# Patient Record
Sex: Female | Born: 1970 | Race: White | Hispanic: No | Marital: Married | Smoking: Never smoker
Health system: Southern US, Community
[De-identification: ages and names within clinical notes are randomized; demographics above are authoritative.]

## PROBLEM LIST (undated history)

## (undated) DIAGNOSIS — IMO0002 Reserved for concepts with insufficient information to code with codable children: Secondary | ICD-10-CM

## (undated) DIAGNOSIS — M7500 Adhesive capsulitis of unspecified shoulder: Secondary | ICD-10-CM

## (undated) HISTORY — PX: OTHER SURGICAL HISTORY: SHX169

## (undated) HISTORY — PX: SHOULDER SURGERY: SHX246

## (undated) HISTORY — PX: APPENDECTOMY: SHX54

## (undated) HISTORY — DX: Adhesive capsulitis of unspecified shoulder: M75.00

## (undated) HISTORY — DX: Reserved for concepts with insufficient information to code with codable children: IMO0002

## (undated) HISTORY — PX: TUBAL LIGATION: SHX77

---

## 2001-05-05 ENCOUNTER — Encounter: Admission: RE | Admit: 2001-05-05 | Discharge: 2001-05-05 | Payer: Self-pay | Admitting: Sports Medicine

## 2001-06-13 ENCOUNTER — Encounter: Admission: RE | Admit: 2001-06-13 | Discharge: 2001-06-13 | Payer: Self-pay | Admitting: Sports Medicine

## 2001-06-17 ENCOUNTER — Other Ambulatory Visit: Admission: RE | Admit: 2001-06-17 | Discharge: 2001-06-17 | Payer: Self-pay | Admitting: Obstetrics and Gynecology

## 2002-06-30 ENCOUNTER — Other Ambulatory Visit: Admission: RE | Admit: 2002-06-30 | Discharge: 2002-06-30 | Payer: Self-pay | Admitting: Obstetrics and Gynecology

## 2002-12-15 ENCOUNTER — Other Ambulatory Visit: Admission: RE | Admit: 2002-12-15 | Discharge: 2002-12-15 | Payer: Self-pay | Admitting: Gynecology

## 2003-01-28 ENCOUNTER — Inpatient Hospital Stay (HOSPITAL_COMMUNITY): Admission: AD | Admit: 2003-01-28 | Discharge: 2003-01-28 | Payer: Self-pay | Admitting: Gynecology

## 2003-07-21 ENCOUNTER — Inpatient Hospital Stay (HOSPITAL_COMMUNITY): Admission: AD | Admit: 2003-07-21 | Discharge: 2003-07-25 | Payer: Self-pay | Admitting: Gynecology

## 2003-07-22 ENCOUNTER — Encounter (INDEPENDENT_AMBULATORY_CARE_PROVIDER_SITE_OTHER): Payer: Self-pay

## 2003-08-31 ENCOUNTER — Other Ambulatory Visit: Admission: RE | Admit: 2003-08-31 | Discharge: 2003-08-31 | Payer: Self-pay | Admitting: Gynecology

## 2003-11-17 ENCOUNTER — Encounter: Admission: RE | Admit: 2003-11-17 | Discharge: 2003-12-17 | Payer: Self-pay | Admitting: Gynecology

## 2004-09-28 ENCOUNTER — Other Ambulatory Visit: Admission: RE | Admit: 2004-09-28 | Discharge: 2004-09-28 | Payer: Self-pay | Admitting: Gynecology

## 2005-04-06 ENCOUNTER — Other Ambulatory Visit: Admission: RE | Admit: 2005-04-06 | Discharge: 2005-04-06 | Payer: Self-pay | Admitting: Gynecology

## 2005-10-23 ENCOUNTER — Inpatient Hospital Stay (HOSPITAL_COMMUNITY): Admission: RE | Admit: 2005-10-23 | Discharge: 2005-10-26 | Payer: Self-pay | Admitting: Gynecology

## 2005-12-07 ENCOUNTER — Other Ambulatory Visit: Admission: RE | Admit: 2005-12-07 | Discharge: 2005-12-07 | Payer: Self-pay | Admitting: Gynecology

## 2006-12-10 ENCOUNTER — Other Ambulatory Visit: Admission: RE | Admit: 2006-12-10 | Discharge: 2006-12-10 | Payer: Self-pay | Admitting: Gynecology

## 2007-08-05 ENCOUNTER — Inpatient Hospital Stay (HOSPITAL_COMMUNITY): Admission: AD | Admit: 2007-08-05 | Discharge: 2007-08-05 | Payer: Self-pay | Admitting: Obstetrics and Gynecology

## 2007-08-18 ENCOUNTER — Inpatient Hospital Stay (HOSPITAL_COMMUNITY): Admission: RE | Admit: 2007-08-18 | Discharge: 2007-08-21 | Payer: Self-pay | Admitting: Obstetrics and Gynecology

## 2007-08-18 ENCOUNTER — Encounter (INDEPENDENT_AMBULATORY_CARE_PROVIDER_SITE_OTHER): Payer: Self-pay | Admitting: Obstetrics and Gynecology

## 2007-08-26 ENCOUNTER — Ambulatory Visit: Admission: RE | Admit: 2007-08-26 | Discharge: 2007-08-26 | Payer: Self-pay | Admitting: Obstetrics and Gynecology

## 2007-09-02 ENCOUNTER — Ambulatory Visit: Admission: RE | Admit: 2007-09-02 | Discharge: 2007-09-02 | Payer: Self-pay | Admitting: Obstetrics and Gynecology

## 2008-12-13 ENCOUNTER — Encounter: Payer: Self-pay | Admitting: Gynecology

## 2008-12-13 ENCOUNTER — Other Ambulatory Visit: Admission: RE | Admit: 2008-12-13 | Discharge: 2008-12-13 | Payer: Self-pay | Admitting: Gynecology

## 2008-12-13 ENCOUNTER — Ambulatory Visit: Payer: Self-pay | Admitting: Gynecology

## 2009-12-15 ENCOUNTER — Ambulatory Visit: Payer: Self-pay | Admitting: Gynecology

## 2009-12-15 ENCOUNTER — Other Ambulatory Visit: Admission: RE | Admit: 2009-12-15 | Discharge: 2009-12-15 | Payer: Self-pay | Admitting: Gynecology

## 2009-12-20 ENCOUNTER — Ambulatory Visit: Payer: Self-pay | Admitting: Gynecology

## 2010-12-18 ENCOUNTER — Ambulatory Visit
Admission: RE | Admit: 2010-12-18 | Discharge: 2010-12-18 | Payer: Self-pay | Source: Home / Self Care | Attending: Gynecology | Admitting: Gynecology

## 2010-12-18 ENCOUNTER — Other Ambulatory Visit (HOSPITAL_COMMUNITY)
Admission: RE | Admit: 2010-12-18 | Discharge: 2010-12-18 | Disposition: A | Discharge status: Home / Self Care | Payer: 59 | Source: Ambulatory Visit | Attending: Gynecology | Admitting: Gynecology

## 2010-12-18 ENCOUNTER — Other Ambulatory Visit: Payer: Self-pay | Admitting: Gynecology

## 2010-12-18 DIAGNOSIS — Z124 Encounter for screening for malignant neoplasm of cervix: Secondary | ICD-10-CM | POA: Insufficient documentation

## 2011-04-03 NOTE — Discharge Summary (Signed)
[name], [name]                ACCOUNT NO.:  [id number]   MEDICAL RECORD NO.:  [id number]          PATIENT TYPE:  INP   LOCATION:                                [facility]   PHYSICIAN:  Osborn Coho, M.D.   DATE OF BIRTH:  1971/01/24   DATE OF ADMISSION:  08/21/2007  DATE OF DISCHARGE:                               DISCHARGE SUMMARY   CHIEF COMPLAINT:  Heart racing and episode of shortness of breath.   HISTORY:  [name] is a 40 year old, gravida 3, para 2, 0, 0, 2, at 38  weeks who presented complaining of heart racing occasionally since last  night with some episodes of shortness of breath.  The patient says she  is feeling a little better now.  She denies any chest pain.  She reports  good fetal movement.  She reports her pulse at times to be 90s-110s.   PHYSICAL EXAMINATION:  VITAL SIGNS:  Pulse 82.  EKG:  Normal sinus  rhythm.  LUNGS:  Clear to auscultation bilaterally.  CV:  Rate and rhythm are regular.  ABDOMEN:  Gravid and nontender.  Fetal heart tracing is reactive.   ASSESSMENT/PLAN:  This is a 40 year old, gravida 3, para 2 at 38 weeks  with questionable palpitations.  Will arrange outpatient appointment  with Cardiology.  The patient may need further workup with Holter  monitor.  Will appreciate Cardiology input.  TSH is pending.  Fetal  status is overall reassuring.  Discharge instructions discussed with the  patient.  The patient to follow up in office.      Osborn Coho, M.D.  Electronically Signed     AR/MEDQ  D:  08/21/2007  T:  08/21/2007  Job:  [id number]

## 2011-04-03 NOTE — Op Note (Signed)
[name], [name]                ACCOUNT NO.:  [id number]   MEDICAL RECORD NO.:  [id number]          PATIENT TYPE:  INP   LOCATION:  9199                          [facility]   PHYSICIAN:  [name], M.D.DATE OF BIRTH:  Jun 11, 1971   DATE OF PROCEDURE:  08/18/2007  DATE OF DISCHARGE:                               OPERATIVE REPORT   PREOPERATIVE DIAGNOSES:  1. Term intrauterine pregnancy.  2. Previous cesarean section.  3. Desires repeat cesarean section.  4. Desires sterilization.   POSTOPERATIVE DIAGNOSES:  1. Term intrauterine pregnancy.  2. Previous cesarean section.  3. Desires repeat cesarean section.  4. Desires sterilization.  5. Fibroid uterus.   PROCEDURES:  1. Repeat low transverse cesarean section.  2. Bilateral tubal ligation.   SURGEON:  [name], M.D.   FIRST ASSISTANT:  [name], C.N.M.   ANESTHETIC:  Spinal.   DISPOSITION:  [name] is a 40 year old female, gravida 3, para 2-0-0-  2, who presents at [redacted] weeks gestation [facility] August 18, 2007).  She has  the above-mentioned diagnosis.  She understands the indications for her  surgical procedure, and she accepts the risks of, but not limited to,  anesthetic complications, bleeding, infections, possible damage to the  surrounding organs, and possible tubal failure (24/1000).   FINDINGS:  A 8 pound 7 ounce female was delivered from a cephalic  presentation.  The Apgars were 9 at one minute and 9 at five minutes.  The uterus, fallopian tubes, and the ovaries were normal, except that  there were two fibroids measuring less than 0.5 cm each on the anterior  portion of the uterus.   PROCEDURE:  The patient was taken to the operating room, where a spinal  anesthetic was given.  The patient's abdomen, perineum, and outer vagina  were prepped with multiple layers of Betadine.  A Foley catheter was  placed in the bladder.  The patient was sterilely draped.  The lower  abdomen  was injected with 10 cc of 0.25% Marcaine with epinephrine.  A  low transverse incision was made in the abdomen through the previous  incision and then carried sharply through the subcutaneous tissue, the  fascia, and the anterior peritoneum.  The bladder flap was developed.  An incision was made in the lower uterine segment and extended in a low  transverse fashion.  The fetal head was delivered with the assistance of  a vacuum extractor.  The mouth and nose were suctioned.  The remainder  of the infant was then delivered.  The cord was clamped and cut, and the  infant was handed to the awaiting pediatric team.  Routine cord blood  studies were obtained.  The placenta was removed.  The uterine cavity  was cleaned of amniotic fluid, clotted blood, and membranes.  The  uterine incision was closed using a running-locking suture of 2-0 Vicryl  followed by an imbricating suture of 2-0 Vicryl.  Hemostasis was  achieved using figure-of-eight sutures of 2-0 Vicryl and 0 Vicryl.  The  left fallopian tube was identified and followed to its fimbriated end.  A knuckle of tube was made on the left using a free tie and then two  suture ligatures of 0 plain catgut.  The knuckle of tube thus made was  excised.  Hemostasis was adequate.  An identical procedure was carried  out on the opposite side.  Again, hemostasis was adequate.  The pelvis  was vigorously irrigated.  The anterior peritoneum and the abdominal  musculature were reapproximated in the midline using 0 Vicryl.  The  fascia was closed using a running suture of 0 Vicryl followed by three  interrupted sutures of 0 Vicryl.  The subcutaneous layer was closed  using a running suture of 0 Vicryl.  The skin was reapproximated using a  subcuticular suture of 3-0 Monocryl.  Sponge, needle, and instrument  counts were correct on two occasions.  The estimated blood loss for the  procedure was 700 cc.  The patient tolerated her procedure well.  The   patient was taken to the recovery room in stable condition.  The  placenta was sent to labor and delivery.  The cut portions of the  fallopian tubes were sent to pathology.      [name], M.D.  Electronically Signed     AVS/MEDQ  D:  08/18/2007  T:  08/18/2007  Job:  [id number]

## 2011-04-03 NOTE — H&P (Signed)
NAMEGARIMA, [name]                ACCOUNT NO.:  [id number]   MEDICAL RECORD NO.:  [id number]          PATIENT TYPE:  INP   LOCATION:  NA                            [facility]   PHYSICIAN:  Janine Limbo, M.D.DATE OF BIRTH:  [birth year]   DATE OF ADMISSION:  08/18/2007  DATE OF DISCHARGE:                              HISTORY & PHYSICAL   HISTORY OF PRESENT ILLNESS:  [name] is a 40 year old female, gravida  3, para 2-0-0-2, who presents at 41 weeks' gestation ([facility] of August 18, 2007).  She has been followed at the [facility].  The patient has  had two prior cesarean sections and she desires a repeat cesarean  section.  The patient also has a history of postpartum depression.   OBSTETRICAL HISTORY:  In 2004 the patient had a cesarean section at term  where she delivered a 9 pound 11 ounce female infant.  In December 2006  the patient had a cesarean section at term where she delivered a 7 pound  10 ounce female infant.   DRUG ALLERGIES:  The patient has seasonal allergies.   PAST MEDICAL HISTORY:  The patient has a history of depression.  She had  a broken elbow in 1980, repaired.  She had her wisdom teeth removed in  1993.  She had her appendix removed in 1999.   SOCIAL HISTORY:  The patient denies cigarette use, alcohol use, and  recreational drug use.   REVIEW OF SYSTEMS:  Normal pregnancy complaints.   FAMILY HISTORY:  Noncontributory.   PHYSICAL EXAM:  Height is 5 feet 9-1/2 inches.  Weight is 190 pounds.  HEENT:  Within normal limits.  CHEST:  Clear.  HEART:  Regular rate and rhythm.  BREASTS:  Without masses.  ABDOMEN:  Gravid with a fundal height of 38 cm.  EXTREMITIES:  Grossly normal.  NEUROLOGIC:  Grossly normal.  PELVIC:  The cervix is fingertip dilated and 50% effaced.   LABORATORY VALUES:  Blood type is A positive, antibody screen negative.  VDRL nonreactive.  Rubella  immune.  HBSAG negative.  Chlamydia negative.  Gonorrhea negative.  Third trimester beta strep is negative.  Glucola  screen is within normal limits.   ASSESSMENT:  1. Forty weeks' gestation.  2. Two prior cesarean sections.  3. Desires repeat cesarean section.  4. Desires sterilization.   PLAN:  The patient will undergo a repeat low transverse cesarean section  and bilateral tubal ligation.  The patient understands the indications  for her surgical procedure and she accepts the risks of, but not limited  to, anesthetic complications, bleeding, infection, possible damage to  the surrounding organs, and possible tubal failure (24 per 1000).      Janine Limbo, M.D.  Electronically Signed     AVS/MEDQ  D:  08/14/2007  T:  08/14/2007  Job:  [id number]

## 2011-04-03 NOTE — Discharge Summary (Signed)
Andrea, [name]                ACCOUNT NO.:  [id number]   MEDICAL RECORD NO.:  [id number]          PATIENT TYPE:  INP   LOCATION:  9135                          [facility]   PHYSICIAN:  Osborn Coho, M.D.   DATE OF BIRTH:  Dec 14, 1970   DATE OF ADMISSION:  08/18/2007  DATE OF DISCHARGE:  08/21/2007                               DISCHARGE SUMMARY   ADMITTING DIAGNOSES:  1. Intrauterine pregnancy at term.  2. Previous cesarean section with desire for repeat.  3. Desires sterilization.   DISCHARGE DIAGNOSES:  1. Intrauterine pregnancy at term.  2. Previous cesarean section with desire for repeat.  3. Desires sterilization.  4. Fibroid uterus.   PROCEDURES:  1. Repeat low transverse cesarean section.  2. Tubal sterilization.  3. Spinal anesthesia.   HOSPITAL COURSE:  The patient is a 40 year old gravida 3, para 2-0-0-2,  who presented at 40 weeks for scheduled repeat cesarean section and  tubal sterilization.  Her history had been remarkable for  1. C-section x2 with plan for repeat.  2. Desires tubal sterilization.  3. History of postpartum depression.  4. Conception on oral contraceptive.   The patient was taken to the operating room where a repeat low  transverse cesarean section and bilateral tubal sterilization were  performed by Dr. Stefano Gaul under spinal anesthesia.  Findings were a  viable female by the name of [name], weight 8 pounds 7 ounces.  Apgars  were 9 and 9.  Infant was taken to the full-term nursery.  Mother was  taken to recovery in good condition.  By postop day #1, patient was  doing well.  She was up ad lib.  She was tolerating p.o. pain  medication.  Her incision was clean, dry and intact.  Her hemoglobin was  10.8 and her white blood cell count was 12.5.  The rest of hospital stay  was uncomplicated.  She did have a social work consult secondary to  history of postpartum depression.  She reported that she was having no  difficulties with this at  this time.  She was breast-feeding.  She was  deemed to receive full benefit of her hospital stay and was discharged  home.   DISCHARGE INSTRUCTIONS:  Per [facility] handout.   DISCHARGE MEDICATIONS:  1. Motrin 600 mg p.o. q.6h p.r.n. pain.  2. Percocet 5/325 one to two p.o. q.3-4h. p.r.n. pain.  3. Prenatal vitamin one p.o. daily.   FOLLOW UP:  Discharge follow-up will occur in 6 weeks at [facility].      [name] [name], C.N.M.      Osborn Coho, M.D.  Electronically Signed    VLL/MEDQ  D:  08/21/2007  T:  08/21/2007  Job:  [id number]

## 2011-04-06 NOTE — Op Note (Signed)
NAMEBRAYDEN, [name]                            ACCOUNT NO.:  [id number]   MEDICAL RECORD NO.:  [id number]                   PATIENT TYPE:  INP   LOCATION:  9126                                 [facility]   PHYSICIAN:  Juan H. Lily Peer, M.D.             DATE OF BIRTH:  24-Dec-1970   DATE OF PROCEDURE:  07/22/2003  DATE OF DISCHARGE:                                 OPERATIVE REPORT   PREOPERATIVE DIAGNOSES:  1. Post dates pregnancy.  2. Arrest of second stage of labor.  3. Fetal tachycardia.   POSTOPERATIVE DIAGNOSES:  1. Post dates pregnancy.  2. Arrest of second stage of labor.  3. Fetal tachycardia.   ANESTHESIA:  Epidural.   PROCEDURE PERFORMED:  Primary lower uterine segment transverse cesarean  section.   SURGEON:  Juan H. Lily Peer, M.D.   INDICATION FOR OPERATION:  A 40 year old gravida 1, para 0, at 79 weeks  estimated gestation age, with fetal tachycardia and arrest of second stage  of labor.   FINDINGS:  A female infant, Apgars of 8 and 9, with a weight of 9 pounds 11  ounces.  Clear amniotic fluid.  Arterial cord pH was 7.31.  Normal maternal  pelvic anatomy.   DESCRIPTION OF OPERATION:  After the patient was adequately counseled, she  was taken to the operating room, where she was redosed through her epidural  catheter.  After an adequate level of anesthesia was obtained, the abdomen  was prepped and draped in the usual sterile fashion.  A Foley catheter had  previously been placed in the labor and delivery room.  A Pfannenstiel skin  incision was made 2 cm above the symphysis pubis.  The incision was carried  down from the skin and subcutaneous tissue down to the rectus fascia,  whereby the fascia was incised in a transverse fashion and the midline raphe  was entered.  The peritoneal cavity was entered cautiously.  The bladder  flap was established and the lower uterine segment was incised in a  transverse fashion.  The newborn was delivered without any  complications.  The nasopharyngeal area was bulb-suctioned.  The cord was doubly clamped and  excised.  The newborn gave an immediate cry, was shown to the parents, and  was passed off to the pediatricians who were in attendance, who gave the  above-mentioned parameters.  After cord blood was obtained, the placenta was  delivered from the intrauterine cavity and passed off the operative field  for pathologic evaluation.  The uterus was then exteriorized.  The  intrauterine cavity was swept clear of remaining products of conception.  The lower uterine segment transverse incision was closed with a running  locking stitch of 0 Vicryl suture.  The uterus was then placed back in the  pelvic cavity.  The pelvic cavity was copiously irrigated with normal saline  solution.  Sponge count and needle count were correct.  After ascertaining  adequate hemostasis, closure was started.  The patient did receive 2 g of  Cefotan for prophylaxis.  The visceral peritoneum was not reapproximated but  the rectus fascia was closed with a running stitch of 0 Vicryl suture.  The  subcutaneous bleeders were Bovie cauterized.  The skin was  reapproximated with skin clips, followed by placement of Xeroform gauze and  a 4 x 8 dressing.  The patient was transferred to the recovery room with  stable vital signs.  Blood loss for the procedure was 800 mL.  IV fluids  were 2400 mL of lactated Ringer's, and urine output was 200 mL and clear.                                               Juan H. Lily Peer, M.D.    JHF/MEDQ  D:  07/22/2003  T:  07/22/2003  Job:  [id number]

## 2011-04-06 NOTE — H&P (Signed)
NAMEDANYEAL, AKENS                            ACCOUNT NO.:  [id number]   MEDICAL RECORD NO.:  [id number]                   PATIENT TYPE:  INP   LOCATION:  9173                                 [facility]   PHYSICIAN:  Juan H. Lily Peer, M.D.             DATE OF BIRTH:  [birth year]   DATE OF ADMISSION:  07/21/2003  DATE OF DISCHARGE:                                HISTORY & PHYSICAL   CHIEF COMPLAINT:  Post-dates pregnancy.   HISTORY:  The patient is a 40 year old gravida 1, para 0, with an estimated  date of confinement of July 12, 2003,  currently 41 weeks' gestation.  Had  been offered induction several days ago and decided to return to the office  today, where she was directly admitted for induction.  Her cervix was found  to be 2 cm, 70% effaced, -2 station, and review of her medical record  indicated that she had declined a maternal serum alpha-fetoprotein, had been  treated for a upper respiratory tract infection, and also [facility] for [facility] therapy as a result of  low back discomfort.  Otherwise the remainder of the prenatal course has  been unremarkable.   PAST MEDICAL HISTORY:  She denies any allergies.  She had a broken elbow in  1981.  She had appendicitis in 2000.  She also had a history of degenerative  disk disease.   REVIEW OF SYSTEMS:  See [employer] form.   PHYSICAL EXAMINATION:  VITAL SIGNS:  The patient's blood pressure was  114/70.  Urine trace protein, negative sugar.  Weight was 186 pounds.  HEENT:  Unremarkable.  NECK:  Supple.  Trachea midline.  No carotid bruits.  No thyromegaly.  CHEST:  Lungs clear to auscultation without rhonchi or wheezing.  CARDIAC:  Regular rate and rhythm, no murmurs or gallop.  BREASTS:  Exam not done.  ABDOMEN:  Gravid uterus, vertex presentation.  Fundal height 40 cm.  PELVIC:  Cervix 2 cm, 70% effaced, -2 station.  EXTREMITIES:  DTR 1+, negative clonus.   PRENATAL  LABORATORY DATA:  A positive blood type, negative antibody screen.  VDRL nonreactive.  Rubella immune.  Hepatitis B surface antigen and HIV were  negative.  Declined alpha-fetoprotein.  GBS culture was negative.  Blood  sugar screen was normal.  She had iron-deficiency anemia, for which she was  placed on iron tablets.   ASSESSMENT:  A 40 year old gravida 1, para 0, at 21 weeks' estimated  gestational age, with slightly advanced cervical dilatation, admitted for  induction.  She had been offered an ultrasound a few days in the office for  AFI and  estimated fetal weight but they prefer not to have an ultrasound again, and  we will go ahead and proceed with Pitocin induction and artificial rupture  of membranes.  All questions were answered, and we will follow  accordingly.   PLAN:  As per assessment above.                                                Juan H. Lily Peer, M.D.    JHF/MEDQ  D:  07/21/2003  T:  07/21/2003  Job:  [id number]

## 2011-04-06 NOTE — Op Note (Signed)
[name], [name]                ACCOUNT NO.:  [id number]   MEDICAL RECORD NO.:  [id number]          PATIENT TYPE:  INP   LOCATION:  9146                          [facility]   PHYSICIAN:  Timothy P. Fontaine, M.D.DATE OF BIRTH:  1971/09/03   DATE OF PROCEDURE:  10/23/2005  DATE OF DISCHARGE:                                 OPERATIVE REPORT   PREOPERATIVE DIAGNOSES:  Pregnancy at term, prior cesarean section, desires  repeat cesarean section.   POSTOPERATIVE DIAGNOSES:  Pregnancy at term, prior cesarean section, desires  repeat cesarean section.   PROCEDURE:  Repeat low transverse cervical cesarean section.   SURGEON:  Timothy P. Fontaine, M.D.   ASSISTANTGaetano Hawthorne. Lily Peer, M.D.   ANESTHETIC:  Spinal.   ESTIMATED BLOOD LOSS:  Less than 500 mL.   COMPLICATIONS:  None.   SPECIMEN:  Samples of cord blood.   FINDINGS:  At the 7:48 normal female, Apgars 08/09, weight 7 pounds 10  ounces. Pelvic anatomy noted to be normal.   PROCEDURE:  The patient was taken to the operating room, underwent spinal  anesthesia, was placed left tilt supine position, received abdominal  preparation with Betadine solution per nursing personnel and a Foley  catheter was placed in sterile technique. The patient was draped in usual  fashion after assuring adequate anesthesia, the abdomen sharply entered  through repeat Pfannenstiel incision achieving adequate hemostasis at all  levels. The bladder flap was sharply and bluntly developed and the uterus  was sharply entered in the lower uterine segment without difficulty. The  bulging membranes were ruptured. The fluid noted to be clear. The infant's  head delivered through the incision and a nuchal cord x2 was reduced. Nares  and mouth were suctioned, the rest of the infant delivered. The cord doubly  clamped and cut. The infant handed to pediatrics in attendance. Samples of  cord blood were obtained. No cord blood banking was requested. The  placenta  was spontaneously extruded. The endometrial cavity explored with a sponge to  remove all placental and membrane fragments. The patient received 1 gram of  Ancef antibiotic prophylaxis at this time. The uterus was exteriorized and  the uterine incision was closed in two layers using 0 Vicryl suture, first a  running interlocking stitch followed by imbricating stitch. The uterus was  then returned to the abdomen which was copiously irrigated. Adequate  hemostasis was visualized. The anterior fascia reapproximated using 0 Vicryl  suture in running stitch starting at the angle, meeting in the middle.  Subcutaneous tissues irrigated.  Adequate hemostasis achieved with electrocautery and the skin was  reapproximated using 4-0 Vicryl running subcuticular stitch. Dermabond skin  adhesive was then applied. Sterile dressing was applied. The patient was  taken to recovery room in good condition having tolerated procedure well.      Timothy P. Fontaine, M.D.  Electronically Signed     TPF/MEDQ  D:  10/23/2005  T:  10/23/2005  Job:  [id number]

## 2011-04-06 NOTE — Discharge Summary (Signed)
[name], [name]                ACCOUNT NO.:  [id number]   MEDICAL RECORD NO.:  [id number]          PATIENT TYPE:  INP   LOCATION:  9146                          [facility]   PHYSICIAN:  Ivor Costa. Farrel Gobble, M.D. DATE OF BIRTH:  [birth year]   DATE OF ADMISSION:  10/23/2005  DATE OF DISCHARGE:                                 DISCHARGE SUMMARY   PRINCIPAL DIAGNOSIS:  Term pregnancy.   PRINCIPAL PROCEDURE:  Was elective repeat cesarean section. Refer the  dictated H&P.   HOSPITAL COURSE:  The patient presented on the morning of 10/23/2005 and  underwent elective repeat cesarean section for delivery of a viable female  with birth weight of 710 Apgars of 09/09, with normal anatomy under spinal  anesthesia.  Estimated minimal blood loss was 500 mL. The postpartum course  was unremarkable. By postpartum day #3 the patient was ready for discharge.  She was breast-feeding without any difficulty and ambulating without  difficulty. Her pain was controlled with oral pain medications. She remained  afebrile. Vitals were stable. Heart was regular rate. Lungs were clear to  auscultation. Abdomen was soft and nontender. Her uterus is firm below the  umbilicus. Her incision was clean, dry, intact. Extremities were negative.   ASSESSMENT:  Status post elective repeat cesarean section. She was  discharged home in stable condition. The patient will continue take over-the-  counter Motrin as needed. She also was given prescription for Percocet 5/325  one to two q.6 h p.r.n. pain #15. Her postpartum hemoglobin was 10.7,  hematocrit was 31.6, platelets 175 and white count was 10.5.      Ivor Costa. Farrel Gobble, M.D.  Electronically Signed     THL/MEDQ  D:  10/26/2005  T:  10/26/2005  Job:  [id number]

## 2011-04-06 NOTE — H&P (Signed)
NAMESURY, WENTWORTH                ACCOUNT NO.:  [id number]   MEDICAL RECORD NO.:  [id number]          PATIENT TYPE:  INP   LOCATION:  NA                            FACILITY:  [facility]   PHYSICIAN:  Timothy P. Fontaine, M.D.DATE OF BIRTH:  [birth year]   DATE OF ADMISSION:  10/23/2005  DATE OF DISCHARGE:                                HISTORY & PHYSICAL   CHIEF COMPLAINT:  Term pregnancy, history of prior cesarean section, for  repeat cesarean section.   HISTORY OF PRESENT ILLNESS:  A 40 year old G70, P25 female at term gestation,  history of prior cesarean section for a repeat cesarean section.  For the  remainder of her history, see her [place].   PHYSICAL EXAMINATION:  HEENT:  Normal.  LUNGS:  Clear.  CARDIAC:  Regular rate without rubs, murmurs or gallops.  ABDOMINAL EXAM:  Gravid, consistent with term, positive fetal heart tones.  PELVIC:  Deferred.   ASSESSMENT/PLAN:  A 40 year old G2, P84 female, term gestation, history of  prior cesarean section who desires repeat cesarean section after counseling  for trial of labor.  The risks, benefits, indications, and alternatives,  expected intraoperative/postoperative courses were reviewed.  I discussed  the risk of infection, internal, requiring prolonged antibiotics as well as  wound complications including hematoma, seromas, infections requiring  opening and draining of incisions, closure by secondary intention.  Risk of  hemorrhage necessitating transfusion and the risks of transfusion to include  transfusion reaction, hepatitis, HIV, mad cow disease and other unknown  entities were reviewed, understood, and accepted.  The risks of inadvertent  injury to internal organs including bowel, bladder, ureters, vessels and  nerves necessitating major exploratory reparative surgeries and future  reparative surgeries including ostomy formation was discussed, understood,  and accepted.  The risks of fetal injury during the birthing process  including musculoskeletal, neural, scalpel injuries were all reviewed with  her.  Lastly, the options for tubal sterilization were discussed, and the  patient declines, does not want tubal sterilization at the time of repeat  cesarean section.  The patient's questions are answered to her satisfaction,  and she is ready to proceed with surgery.      Timothy P. Fontaine, M.D.  Electronically Signed     TPF/MEDQ  D:  09/28/2005  T:  09/28/2005  Job:  [id number]

## 2011-04-06 NOTE — Discharge Summary (Signed)
   [name], [name]                            ACCOUNT NO.:  [id number]   MEDICAL RECORD NO.:  [id number]                   PATIENT TYPE:  INP   LOCATION:  9126                                 [facility]ACILITY:  [facility]   PHYSICIAN:  Timothy P. Fontaine, M.D.           DATE OF BIRTH:  09-15-1971   DATE OF ADMISSION:  07/21/2003  DATE OF DISCHARGE:  07/25/2003                                 DISCHARGE SUMMARY   DISCHARGE DIAGNOSIS:  Intrauterine pregnancy post dates for an induction.   PROCEDURE:  Primary C-section of a viable female.   HISTORY OF PRESENT ILLNESS:  A 40 year old gravida 1, para 0 with an EDC of  July 12, 2003, currently 41-2/7 weeks, had been offered an induction  several days earlier.  She refused.  Returned to the office today with a  direct admit for induction.  Cervix was found to be 2 cm, 70% effaced, -2.  Prenatally, her only complication had been an upper respiratory infection  once, and she had been treated for low back pain with some physical therapy.   LABORATORY DATA:  Blood type is A positive, antibody negative, serology  nonreactive, rubella titer positive, hepatitis nonreactive, HIV nonreactive  and group B strep negative.   HOSPITAL COURSE AND TREATMENT:  She was admitted for post dates for an  induction.  She progressed to complete.  She was found to have CPD after  pushing and then had a primary C-section for failure to progress and CPD.  She delivered a viable female with Apgars of 8 and 9.  Birth weight was found  to be 9 pounds 11 ounces.  Postpartum, she remained afebrile.  She had  asymptomatic anemia, but did well, voiding and was discharged home on her  third postop day.   DISCHARGE LABORATORIES:  White count 11.4, hemoglobin 8.1, hematocrit 23.1,  platelets 143.   DISPOSITION:  She was discharged home, instructed to follow up in six weeks  or as needed, continue prenatal vitamins and iron daily, prescription for  Tylox 1-2 q.4-6h. p.r.n. pain.   __________ discharge booklets, staples  removed, Steri-Strips applied.     Davonna Belling. Young, N.P.                      Timothy P. Audie Box, M.D.    [facility]  D:  08/16/2003  T:  08/16/2003  Job:  [id number]

## 2011-08-13 ENCOUNTER — Other Ambulatory Visit: Payer: Self-pay | Admitting: *Deleted

## 2011-08-13 MED ORDER — IBUPROFEN 800 MG PO TABS: 800.0000 mg | ORAL_TABLET | Freq: Three times a day (TID) | ORAL | Status: AC | PRN

## 2011-08-13 NOTE — Telephone Encounter (Signed)
Pt called wanting refill on her Motrin 800 mg tablets. Pt states that her Rx expired. Please advise.

## 2011-08-30 LAB — URINALYSIS, ROUTINE W REFLEX MICROSCOPIC
Glucose, UA: NEGATIVE
Protein, ur: NEGATIVE
pH: 6

## 2011-08-30 LAB — CBC
HCT: 34.8 — ABNORMAL LOW
Hemoglobin: 12.1
Hemoglobin: 12.2
MCHC: 34.3
MCV: 95
RBC: 3.3 — ABNORMAL LOW
RBC: 3.69 — ABNORMAL LOW
RDW: 13
WBC: 12.4 — ABNORMAL HIGH
WBC: 8.3

## 2011-08-30 LAB — URINE MICROSCOPIC-ADD ON

## 2011-08-30 LAB — COMPREHENSIVE METABOLIC PANEL
Alkaline Phosphatase: 84
BUN: 3 — ABNORMAL LOW
CO2: 21
Chloride: 107
GFR calc non Af Amer: >60
Glucose, Bld: 85
Potassium: 3.4 — ABNORMAL LOW
Total Bilirubin: 1

## 2011-08-30 LAB — TSH: TSH: 2.546

## 2012-01-14 ENCOUNTER — Encounter: Payer: Self-pay | Admitting: *Deleted

## 2012-01-15 ENCOUNTER — Ambulatory Visit (INDEPENDENT_AMBULATORY_CARE_PROVIDER_SITE_OTHER): Payer: 59 | Admitting: Gynecology

## 2012-01-15 ENCOUNTER — Encounter: Payer: Self-pay | Admitting: Gynecology

## 2012-01-15 VITALS — BP 104/70 | Ht 68.5 in | Wt 156.0 lb

## 2012-01-15 DIAGNOSIS — Z01419 Encounter for gynecological examination (general) (routine) without abnormal findings: Secondary | ICD-10-CM

## 2012-01-15 DIAGNOSIS — N920 Excessive and frequent menstruation with regular cycle: Secondary | ICD-10-CM

## 2012-01-15 NOTE — Progress Notes (Signed)
[name] 1971-01-26 [id number]        41 y.o.  for annual exam.  Menses continue to be heavy.  Past medical history,surgical history, medications, allergies, family history and social history were all reviewed and documented in the EPIC chart. ROS:  Was performed and pertinent positives and negatives are included in the history.  Exam: Amy chaperone present Filed Vitals:   01/15/12 0955  BP: 104/70   General appearance  Normal Skin grossly normal Head/Neck normal with no cervical or supraclavicular adenopathy thyroid normal Lungs  clear Cardiac RR, without RMG Abdominal  soft, nontender, without masses, organomegaly or hernia Breasts  examined lying and sitting without masses, retractions, discharge or axillary adenopathy. Pelvic  Ext/BUS/vagina  normal   Cervix  normal    Uterus  retroverted, normal size, shape and contour, midline and mobile nontender   Adnexa  Without masses or tenderness    Anus and perineum  normal   Rectovaginal  normal sphincter tone without palpated masses or tenderness.    Assessment/Plan:  41 y.o. female for annual exam.   Status post BTL 1. Menorrhagia. Patient's periods continue to be heavy. She has been evaluated with a sonohysterogram and an endometrial biopsy in the past which were negative and normal hormone levels. We again discussed treatment options to include hormonal manipulation, Mirena IUD, endometrial ablation, hysterectomy. Patient's not interested in doing anything at present for financial reasons and prefers to monitor. 2. Pap smear. No Pap smear was done today. Her last Pap smear was 2012. She has no history of abnormal Pap smears before with numerous normal reports in her chart. Current screening guidelines were reviewed and we'll plan on every 3 year screening. 3. Breast health. Screening mammogram recommendations between 35 and 40 were reviewed. Patient has no family history and prefers to wait closer to 40. SBE monthly  reviewed. 4. Health maintenance. No blood work was done today as was all recently done historically at her primary physician's office. She was told she was anemic and is on extra iron for this. Assuming the patient continues well then she will see me in a year, sooner as needed.     [name] MD, 10:31 AM 01/15/2012

## 2012-01-15 NOTE — Patient Instructions (Signed)
Follow up in one year for your annual gynecologic exam. Sooner if you want to discuss treatment options for your heavy periods.

## 2012-01-16 LAB — URINALYSIS W MICROSCOPIC + REFLEX CULTURE
Glucose, UA: NEGATIVE mg/dL
Leukocytes, UA: NEGATIVE
Nitrite: NEGATIVE
Protein, ur: NEGATIVE mg/dL
Squamous Epithelial / LPF: NONE SEEN
Urobilinogen, UA: 0.2 mg/dL (ref 0.0–1.0)

## 2012-08-05 ENCOUNTER — Telehealth: Payer: Self-pay | Admitting: *Deleted

## 2012-08-05 MED ORDER — IBUPROFEN 800 MG PO TABS: 800.0000 mg | ORAL_TABLET | Freq: Three times a day (TID) | ORAL | Status: DC | PRN

## 2012-08-05 NOTE — Telephone Encounter (Signed)
Please: Motrin 800 mg to take 1 by mouth 3 times a day when necessary #30

## 2012-08-05 NOTE — Telephone Encounter (Signed)
(  TF patient) Pt calling requesting refill on Ibuprofen 800 mg tablets, pt has bad menstrual cramps and this helps. Please advise

## 2012-08-05 NOTE — Telephone Encounter (Signed)
Pt informed with the below note. rx sent 

## 2013-03-25 ENCOUNTER — Encounter: Payer: Self-pay | Admitting: Gynecology

## 2013-04-03 ENCOUNTER — Encounter: Payer: Self-pay | Admitting: Gynecology

## 2013-04-03 ENCOUNTER — Ambulatory Visit (INDEPENDENT_AMBULATORY_CARE_PROVIDER_SITE_OTHER): Payer: BC Managed Care – PPO | Admitting: Gynecology

## 2013-04-03 VITALS — BP 120/70 | Ht 69.0 in | Wt 158.0 lb

## 2013-04-03 DIAGNOSIS — Z13220 Encounter for screening for lipoid disorders: Secondary | ICD-10-CM

## 2013-04-03 DIAGNOSIS — Z01419 Encounter for gynecological examination (general) (routine) without abnormal findings: Secondary | ICD-10-CM

## 2013-04-03 DIAGNOSIS — N920 Excessive and frequent menstruation with regular cycle: Secondary | ICD-10-CM

## 2013-04-03 LAB — CBC WITH DIFFERENTIAL/PLATELET
Eosinophils Absolute: 0.1 10*3/uL (ref 0.0–0.7)
Eosinophils Relative: 1 % (ref 0–5)
HCT: 31 % — ABNORMAL LOW (ref 36.0–46.0)
Hemoglobin: 9.8 g/dL — ABNORMAL LOW (ref 12.0–15.0)
Lymphs Abs: 1.7 10*3/uL (ref 0.7–4.0)
MCH: 25.3 pg — ABNORMAL LOW (ref 26.0–34.0)
MCV: 79.9 fL (ref 78.0–100.0)
Monocytes Absolute: 0.5 10*3/uL (ref 0.1–1.0)
Monocytes Relative: 8 % (ref 3–12)
RBC: 3.88 MIL/uL (ref 3.87–5.11)

## 2013-04-03 LAB — COMPREHENSIVE METABOLIC PANEL
CO2: 25 mEq/L (ref 19–32)
Calcium: 9.2 mg/dL (ref 8.4–10.5)
Chloride: 101 mEq/L (ref 96–112)
Creat: 0.79 mg/dL (ref 0.50–1.10)
Glucose, Bld: 86 mg/dL (ref 70–99)
Total Bilirubin: 0.5 mg/dL (ref 0.3–1.2)
Total Protein: 7 g/dL (ref 6.0–8.3)

## 2013-04-03 LAB — LIPID PANEL
Cholesterol: 178 mg/dL (ref 0–200)
Total CHOL/HDL Ratio: 2.7 Ratio
Triglycerides: 78 mg/dL (ref <150)
VLDL: 16 mg/dL (ref 0–40)

## 2013-04-03 NOTE — Patient Instructions (Addendum)
Office will call you with information about endometrial ablation.  Call to Schedule your mammogram  Facilities in [place]: 1)  [facility] [place], [place] Phone: [phone] 2)  [facility], [phone] Phone: [phone] 3)  Dr. Yolanda Bonine at [facility] [address] Phone: [phone]     Mammogram A mammogram is an X-ray test to find changes in a woman's breast. You should get a mammogram if:  You are 40 years of age or older  You have risk factors.   Your doctor recommends that you have one.  BEFORE THE TEST  Do not schedule the test the week before your period, especially if your breasts are sore during this time.  On the day of your mammogram:  Wash your breasts and armpits well. After washing, do not put on any deodorant or talcum powder on until after your test.   Eat and drink as you usually do.   Take your medicines as usual.   If you are diabetic and take insulin, make sure you:   Eat before coming for your test.   Take your insulin as usual.   If you cannot keep your appointment, call before the appointment to cancel. Schedule another appointment.  TEST  You will need to undress from the waist up. You will put on a hospital gown.   Your breast will be put on the mammogram machine, and it will press firmly on your breast with a piece of plastic called a compression paddle. This will make your breast flatter so that the machine can X-ray all parts of your breast.   Both breasts will be X-rayed. Each breast will be X-rayed from above and from the side. An X-ray might need to be taken again if the picture is not good enough.   The mammogram will last about 15 to 30 minutes.  AFTER THE TEST Finding out the results of your test Ask when your test results will be ready. Make sure you get your test results.  Document Released: 02/01/2009 Document  Revised: 10/25/2011 Document Reviewed: 02/01/2009 [facility] Patient Information 2012 [place], [place].

## 2013-04-03 NOTE — Progress Notes (Signed)
WREN PRYCE [birth year] [id number]        42 y.o.  G3P3003 for annual exam.  Several issues discussed below.  Past medical history,surgical history, medications, allergies, family history and social history were all reviewed and documented in the EPIC chart. ROS:  Was performed and pertinent positives and negatives are included in the history.  Exam: Kim assistant Filed Vitals:   04/03/13 1034  BP: 120/70  Height: 5\' 9"  (1.753 m)  Weight: 158 lb (71.668 kg)   General appearance  Normal Skin grossly normal Head/Neck normal with no cervical or supraclavicular adenopathy thyroid normal Lungs  clear Cardiac RR, without RMG Abdominal  soft, nontender, without masses, organomegaly or hernia Breasts  examined lying and sitting without masses, retractions, discharge or axillary adenopathy. Pelvic  Ext/BUS/vagina  normal   Cervix  normal   Uterus  retroverted, normal size, shape and contour, midline and mobile nontender   Adnexa  Without masses or tenderness    Anus and perineum  normal   Rectovaginal  normal sphincter tone without palpated masses or tenderness.    Assessment/Plan:  42 y.o. G69P3003 female for annual exam regular menses, tubal sterilization.   1. Menorrhagia. Patient's menses continue heavy. She had a negative study histogram several years ago. Recheck CBC TSH today. Reviewed options with her to include hormonal manipulation Mirena IUD ablation and hysterectomy. Patient had questions about endometrial ablation. I reviewed the procedure with HerOption both the intraoperative and postoperative courses. She understands that she should never pursue pregnancy following the procedure and that there are failures with continued or recurrent menorrhagia afterwards. Patient wants to think about HerOption and asked if we could check insurance coverage for it. If she does proceed with this I did recommend repeating her sonohysterogram before hand as it has been several  years. 2. Mammography never. Recommended scheduling her first mammogram this year as she turned 42. SBE monthly reviewed. 3. Pap smear 2012. No Pap smear done today. No history of abnormal Pap smears previously. Plan repeat next year at the 3 year interval. 4. Health maintenance. Baseline CBC comprehensive metabolic panel lipid profile urinalysis ordered along with her TSH. Followup with her decision as far as treatment of her menorrhagia, otherwise annually.    [name] MD, 11:22 AM 04/03/2013

## 2013-04-04 LAB — URINALYSIS W MICROSCOPIC + REFLEX CULTURE
Bacteria, UA: NONE SEEN
Bilirubin Urine: NEGATIVE
Crystals: NONE SEEN
Glucose, UA: NEGATIVE mg/dL
Ketones, ur: NEGATIVE mg/dL
Specific Gravity, Urine: 1.018 (ref 1.005–1.030)
Squamous Epithelial / LPF: NONE SEEN
Urobilinogen, UA: 0.2 mg/dL (ref 0.0–1.0)

## 2013-04-08 ENCOUNTER — Telehealth: Payer: Self-pay

## 2013-04-08 NOTE — Telephone Encounter (Signed)
Dr. Velvet Bathe had asked me to check benefits for Her Option Ablation for patient. He stated if she decided she wanted to schedule ablation that she would need SHGM first.  I checked benefits for both procedures and notified patient. She has met her $2500 ded and has a 90/10 co-insurance. Her part of SHGM would be roughly  $137 and HO Ablation would be $459. She said she will discuss this with her husband and will call when ready to schedule. I did explain she will need SHGM first. She has had tubal ligation.

## 2013-04-10 ENCOUNTER — Other Ambulatory Visit: Payer: Self-pay | Admitting: Gynecology

## 2013-05-21 ENCOUNTER — Telehealth: Payer: Self-pay

## 2013-05-21 ENCOUNTER — Other Ambulatory Visit: Payer: Self-pay | Admitting: Gynecology

## 2013-05-21 DIAGNOSIS — N920 Excessive and frequent menstruation with regular cycle: Secondary | ICD-10-CM

## 2013-05-21 NOTE — Telephone Encounter (Signed)
Patient called in voice mail stating she has decided she wants Her Option Ablation.  She is aware that she needs SHGM first and is ready to get that scheduled. She had questions about the timing for both procedures. She has had tubal ligation.  I left detailed msg on her voice mail at her request with the info. She will call to schedule SHGM. Order put in system.

## 2013-06-19 ENCOUNTER — Ambulatory Visit (INDEPENDENT_AMBULATORY_CARE_PROVIDER_SITE_OTHER): Payer: BC Managed Care – PPO

## 2013-06-19 ENCOUNTER — Encounter: Payer: Self-pay | Admitting: Gynecology

## 2013-06-19 ENCOUNTER — Ambulatory Visit (INDEPENDENT_AMBULATORY_CARE_PROVIDER_SITE_OTHER): Payer: BC Managed Care – PPO | Admitting: Gynecology

## 2013-06-19 ENCOUNTER — Other Ambulatory Visit: Payer: Self-pay | Admitting: Gynecology

## 2013-06-19 DIAGNOSIS — D649 Anemia, unspecified: Secondary | ICD-10-CM

## 2013-06-19 DIAGNOSIS — N920 Excessive and frequent menstruation with regular cycle: Secondary | ICD-10-CM

## 2013-06-19 NOTE — Progress Notes (Signed)
Patient presents for sonohysterogram due to history of menorrhagia.  Ultrasound shows uterus normal size and echotexture. Endometrial echo 18 mm. Right left ear was visualized and normal. Cul-de-sac negative.  Sonohysterogram performed, sterile technique, easy catheter introduction, good distention with no abnormalities. Endometrial sample taken. Patient tolerated well.  Assessment and plan: Menorrhagia. Iron deficiency anemia. Recently had iron studies done at her primary which were low. She is on iron twice daily. Options for menorrhagia discussed to include observation, hormonal manipulation, Mirena IUD, endometrial ablation, hysterectomy. Patient's leaning strongly towards HerOption endometrial ablation.  She will followup with [place] with her decision. She knows to followup for the biopsy results also.

## 2013-06-19 NOTE — Patient Instructions (Signed)
Office will call you with the biopsy results. He will followup with [place] as to your decision as far as HerOption endometrial ablation

## 2013-09-11 ENCOUNTER — Ambulatory Visit (INDEPENDENT_AMBULATORY_CARE_PROVIDER_SITE_OTHER): Payer: BC Managed Care – PPO | Admitting: Podiatrist

## 2013-09-11 ENCOUNTER — Encounter: Payer: Self-pay | Admitting: Podiatrist

## 2013-09-11 VITALS — BP 106/66 | HR 66 | Resp 14 | Ht 68.0 in | Wt 160.0 lb

## 2013-09-11 DIAGNOSIS — B351 Tinea unguium: Secondary | ICD-10-CM

## 2013-09-17 NOTE — Progress Notes (Signed)
Patient returns for lasering of Left hallux nail.,  Laserering of Toenails left carried out at today's visit via Q-switch YAG laser by QClear Laser at continuous Patient and staff were wearing appropriate laser protective goggles/eyewear Laser device was tested prior to use and safety protocols were followed Frequency of 5 Hz, Level 4, Joules 7.5 delivered.          The patient tolerated the lasering well and will call if any concerns arise.

## 2013-09-28 ENCOUNTER — Other Ambulatory Visit: Payer: Self-pay | Admitting: Gynecology

## 2013-09-28 ENCOUNTER — Telehealth: Payer: Self-pay

## 2013-09-28 DIAGNOSIS — N920 Excessive and frequent menstruation with regular cycle: Secondary | ICD-10-CM

## 2013-09-28 MED ORDER — MISOPROSTOL 200 MCG PO TABS: ORAL_TABLET | ORAL | Status: DC

## 2013-09-28 MED ORDER — MISOPROSTOL 200 MCG PO TABS: 200.0000 ug | ORAL_TABLET | Freq: Four times a day (QID) | ORAL | Status: DC

## 2013-09-28 MED ORDER — PROGESTERONE MICRONIZED 200 MG PO CAPS: 200.0000 mg | ORAL_CAPSULE | Freq: Every day | ORAL | Status: DC

## 2013-09-28 MED ORDER — PROGESTERONE MICRONIZED 200 MG PO CAPS: ORAL_CAPSULE | ORAL | Status: DC

## 2013-09-28 NOTE — Telephone Encounter (Signed)
Ablation scheduled for 10/12/13 at 9am.  Patient was instructed to start Prometrium tonight and take qhs until procedure. We discussed the need for full bladder day of surgery and also, for someone to drive her to and from procedure.  We discussed the need for Cytotec intravaginally the night before.  She will be scheduled for pre op and post appt appts by the appt desk.  Rxs sent to pharmacy. I will mail full bladder instructions and dates/times to patient.

## 2013-09-28 NOTE — Telephone Encounter (Signed)
Patient wants to schedule Her Option Ablation before first of year.  Her menses began late on Thursday 11/6.  I can get her in on 11/24 for ablation. However, I told her I would need to check with you since protocol dictates starting Prometrium on Day 3 and she has passed that.

## 2013-09-28 NOTE — Telephone Encounter (Signed)
OK to go ahead and schedule starting Prometrium now

## 2013-10-06 ENCOUNTER — Ambulatory Visit: Payer: BC Managed Care – PPO | Admitting: Gynecology

## 2013-10-09 ENCOUNTER — Encounter: Payer: Self-pay | Admitting: Gynecology

## 2013-10-09 ENCOUNTER — Other Ambulatory Visit: Payer: Self-pay | Admitting: Gynecology

## 2013-10-09 ENCOUNTER — Ambulatory Visit (INDEPENDENT_AMBULATORY_CARE_PROVIDER_SITE_OTHER): Payer: BC Managed Care – PPO | Admitting: Gynecology

## 2013-10-09 DIAGNOSIS — L290 Pruritus ani: Secondary | ICD-10-CM

## 2013-10-09 DIAGNOSIS — N644 Mastodynia: Secondary | ICD-10-CM

## 2013-10-09 DIAGNOSIS — N920 Excessive and frequent menstruation with regular cycle: Secondary | ICD-10-CM

## 2013-10-09 MED ORDER — FLUCONAZOLE 150 MG PO TABS: 150.0000 mg | ORAL_TABLET | Freq: Once | ORAL | Status: DC

## 2013-10-09 MED ORDER — HYDROCODONE-ACETAMINOPHEN 2.5-500 MG PO TABS: ORAL_TABLET | ORAL | Status: DC

## 2013-10-09 MED ORDER — AZITHROMYCIN 250 MG PO TABS: ORAL_TABLET | ORAL | Status: DC

## 2013-10-09 MED ORDER — DIAZEPAM 2 MG PO TABS: ORAL_TABLET | ORAL | Status: DC

## 2013-10-09 NOTE — Patient Instructions (Signed)
A Diflucan pill daily for 10 days. Followup for the endometrial ablation as scheduled.

## 2013-10-09 NOTE — Progress Notes (Signed)
Patient presents preoperative for her upcoming HerOption endometrial ablation.  She has read through the consent form and signed it. I reviewed the proposed procedure with her to include the intraoperative and postoperative courses. Risks of uterine perforation with damage to internal organs or transuterine cryo- damage to internal organs including bowel bladder ureters vessels nerves requiring major reparative surgeries was discussed.  Hemorrhage necessitating treatment as well as the risk of infection was reviewed. No guarantees as far as menorrhagia relief was made and she understands that her bleeding may continue heavy or worsened following the procedure requiring followup treatments. She is status post tubal sterilization but understands that she should never pursue pregnancy following this procedure and that it can be very dangerous.  I reviewed her medications and how to use them.  The patient is also complaining of several months of intermittent perianal itching that comes and goes. Also some right breast itching and discomfort reminiscent as to when she had yeast mastitis during breast-feeding. No masses or nipple discharge noted.  Exam was chem Asst. Both breasts examined lying and sitting without masses retractions discharge adenopathy. Abdomen soft nontender without masses guarding rebound organomegaly. Pelvic external BUS vagina normal. Cervix normal. Uterus retroverted normal size midline mobile nontender. Adnexa without masses or tenderness. Perianal exam shows erythematous perianal halo consistent with dermatitis.  Assessment and plan: 1. Menorrhagia status post tubal sterilization for HerOption endometrial ablation. The patient will followup as scheduled. 2. Pruritus ani. Will cover with Diflucan 200 mg daily x10 days. Followup if symptoms persist. 3. Right breast discomfort which I think maybe associated with yeast. Will cover as #2 is if this does not resolve. If it persists we'll  pursue a more involved evaluation.

## 2013-10-12 ENCOUNTER — Encounter: Payer: Self-pay | Admitting: Gynecology

## 2013-10-12 ENCOUNTER — Ambulatory Visit (INDEPENDENT_AMBULATORY_CARE_PROVIDER_SITE_OTHER): Payer: BC Managed Care – PPO

## 2013-10-12 ENCOUNTER — Ambulatory Visit (INDEPENDENT_AMBULATORY_CARE_PROVIDER_SITE_OTHER): Payer: BC Managed Care – PPO | Admitting: Gynecology

## 2013-10-12 VITALS — BP 122/74 | HR 64

## 2013-10-12 DIAGNOSIS — N949 Unspecified condition associated with female genital organs and menstrual cycle: Secondary | ICD-10-CM

## 2013-10-12 DIAGNOSIS — R102 Pelvic and perineal pain: Secondary | ICD-10-CM

## 2013-10-12 DIAGNOSIS — N920 Excessive and frequent menstruation with regular cycle: Secondary | ICD-10-CM

## 2013-10-12 HISTORY — PX: ENDOMETRIAL ABLATION: SHX621

## 2013-10-12 MED ORDER — HYDROCODONE-ACETAMINOPHEN 5-300 MG PO TABS: 1.0000 | ORAL_TABLET | ORAL | Status: DC

## 2013-10-12 MED ORDER — LIDOCAINE HCL 1 % IJ SOLN
10.0000 mL | Freq: Once | INTRAMUSCULAR | Status: AC
  Administered 2013-10-12: 10 mL

## 2013-10-12 MED ORDER — KETOROLAC TROMETHAMINE 30 MG/ML IJ SOLN
60.0000 mg | Freq: Once | INTRAMUSCULAR | Status: AC
  Administered 2013-10-12: 60 mg via INTRAMUSCULAR

## 2013-10-12 NOTE — Patient Instructions (Signed)

## 2013-10-12 NOTE — Progress Notes (Signed)
HER OPTION ENDOMETRIAL ABLATION PROCEDURAL NOTE    [name] 1971-01-08 [id number]   10/12/2013  Diagnosis:  Excessive uterine bleeding / Menorrhagia  Procedure:  Endometrial cryoablation with intraoperative ultrasonic guidance  Procedure:  The patient was brought to the treatment room having previously been counseled for the procedure and having signed the consent form that is scanned into Epic. Pre procedural medications received were Valium 1 mg and Toradol 60 mg IM.  The patient was placed in the dorsal lithotomy position and a speculum was inserted. The cervix and upper vagina were cleansed with Betadine. A single-tooth tenaculum was placed on the anterior lip of the cervix. A  paracervical block was placed yes using 10 cc's of 1% lidocaine.  The uterus was yes sounded to 9 centimeters. Cervical dilatation performed no . Under ultrasound guidance, the Her Option probe was introduced into the uterine cavity after the preprocedural sequence was performed. After assuring proper cornual placement, cryoablation was then performed under continuous ultrasound guidance monitoring the growth of the cryo-zone. Sequential cryoablation's were performed in the following order:   Location   Length of Time Myometrial Depth 1. Right cornua   6 minutes  5.2 mm sagittal/5.2 mm trans-verse  2. Left cornua   5 minutes  7.1 mm sagittal/6.3 mm transverse mm    Upon completion of the procedure the instruments were removed, hemostasis visualized the patient was assisted to the bathroom and then to another exam room where she was observed. The patient tolerated the procedure well and was released in stable condition with her driver along with a copy of the postprocedural instructions and precautions which were reviewed with her. She is to return to the office ate December at 10:20 AM for post procedural check. She was given a prescription for Vicodin 5.0/300 #20. Previously was given a prescription for 2.5 mg  but they did not have this at the pharmacy.  Note: This document was prepared with digital dictation and possible smart phrase technology. Any transcriptional errors that result from this process are unintentional.  [name] MD, 10:40 AM 10/12/2013

## 2013-10-26 ENCOUNTER — Ambulatory Visit (INDEPENDENT_AMBULATORY_CARE_PROVIDER_SITE_OTHER): Payer: BC Managed Care – PPO | Admitting: Gynecology

## 2013-10-26 ENCOUNTER — Encounter: Payer: Self-pay | Admitting: Gynecology

## 2013-10-26 DIAGNOSIS — Z9889 Other specified postprocedural states: Secondary | ICD-10-CM

## 2013-10-26 NOTE — Progress Notes (Signed)
Patient presents for postoperative visit status post HerOption endometrial ablation. Has done well without complaints.  Exam was Administrator, [employer] vagina normal. Cervix normal with mucousy discharge. Uterus normal size midline mobile nontender. Adnexa without masses or tenderness.  Assessment and plan: Normal postop check status post HerOption endometrial ablation. Keep menstrual calendar. As long as acceptable then followup in May 2015 for annual exam. Sooner if any issues.

## 2013-10-26 NOTE — Patient Instructions (Signed)
Keep a menstrual calendar. As long as acceptable then followup in May 2015 for your annual exam.

## 2013-10-27 ENCOUNTER — Telehealth: Payer: Self-pay | Admitting: *Deleted

## 2013-10-27 NOTE — Telephone Encounter (Addendum)
Pt states she was given the coupon for [facility], but the rx was not called in.  10/28/2013, Dr Irving Shows ordered [name] 5% topical solution 10.68ml apply 1 drop topically to affected area daily 2 refills.  I left a message with Dr Faylene Million orders and and explanation of the use of [employer].  I encouraged pt to call with concerns, and that I would place the order now.

## 2013-10-28 MED ORDER — TAVABOROLE 5 % EX SOLN: 1.0000 [drp] | CUTANEOUS | Status: DC

## 2013-11-05 ENCOUNTER — Ambulatory Visit: Payer: BC Managed Care – PPO | Admitting: Podiatrist

## 2013-11-06 ENCOUNTER — Ambulatory Visit: Payer: BC Managed Care – PPO | Admitting: Podiatrist

## 2013-11-20 ENCOUNTER — Ambulatory Visit: Payer: BC Managed Care – PPO | Admitting: Podiatrist

## 2013-12-04 ENCOUNTER — Ambulatory Visit: Payer: BC Managed Care – PPO | Admitting: Podiatrist

## 2013-12-25 ENCOUNTER — Encounter: Payer: Self-pay | Admitting: Podiatrist

## 2013-12-25 ENCOUNTER — Ambulatory Visit (INDEPENDENT_AMBULATORY_CARE_PROVIDER_SITE_OTHER): Payer: BC Managed Care – PPO | Admitting: Podiatrist

## 2013-12-25 VITALS — BP 101/59 | HR 73

## 2013-12-25 DIAGNOSIS — B351 Tinea unguium: Secondary | ICD-10-CM

## 2013-12-25 NOTE — Progress Notes (Signed)
Patient presents today with her daughter stating "I can tell a difference in my toenails and I think this is laser treatment 3 or 4 I cant remember" we've been treating her left hallux nail which has been growing out well and there is a definitive line of new growth noted at today's visit.  Laserering of Toenails left  First was carried out at today's visit via Q-switch YAG laser by QClear Laser at continuous Patient and staff were wearing appropriate laser protective goggles/eyewear Laser device was tested prior to use and safety protocols were followed Frequency of 5 Hz, Level 4, Joules 7.5 delivered.   The patient tolerated the lasering well and will call if any concerns arise.  I will see her back in 1 month for another treatment-- we will watch this toenail as it grows. She was rx's kerydin.

## 2014-02-05 ENCOUNTER — Encounter: Payer: Self-pay | Admitting: Podiatrist

## 2014-02-05 ENCOUNTER — Ambulatory Visit (INDEPENDENT_AMBULATORY_CARE_PROVIDER_SITE_OTHER): Payer: BC Managed Care – PPO | Admitting: Podiatrist

## 2014-02-05 VITALS — BP 109/62 | HR 59 | Resp 16

## 2014-02-05 DIAGNOSIS — B351 Tinea unguium: Secondary | ICD-10-CM

## 2014-02-05 NOTE — Progress Notes (Signed)
Patient presents today with her daughter for continued laser treatment on the left great toe.  We've been treating her left hallux nail which has been growing out well and there continues to be a definitive line of new growth noted at today's visit.   Laserering of Toenails left First was carried out at today's visit via Q-switch YAG laser by QClear Laser at continuous  Patient and staff were wearing appropriate laser protective goggles/eyewear  Laser device was tested prior to use and safety protocols were followed  Frequency of 5 Hz, Level 4, Joules 7.5 delivered.   The patient tolerated the lasering well and will call if any concerns arise.  I will see her back in 1 month for another treatment-- we will watch this toenail as it grows.

## 2014-03-19 ENCOUNTER — Encounter: Payer: Self-pay | Admitting: Podiatrist

## 2014-03-19 ENCOUNTER — Ambulatory Visit (INDEPENDENT_AMBULATORY_CARE_PROVIDER_SITE_OTHER): Payer: Self-pay | Admitting: Podiatrist

## 2014-03-19 VITALS — BP 105/68 | HR 61 | Resp 18

## 2014-03-19 DIAGNOSIS — B351 Tinea unguium: Secondary | ICD-10-CM

## 2014-03-19 NOTE — Progress Notes (Signed)
I don't see any difference in my nail and it seems like it hasn't grown any   Patient presents today with her daughter for continued laser treatment on the left great toe. We've been treating her left hallux nail which has been growing out well and there continues to be a definitive line of new growth noted at today's visit.  Laserering of Toenails left First was carried out at today's visit via Q-switch YAG laser by QClear Laser at continuous  Patient and staff were wearing appropriate laser protective goggles/eyewear  Laser device was tested prior to use and safety protocols were followed  Frequency of 5 Hz, Level 4, Joules 7.5 delivered.  The patient tolerated the lasering well and will call if any concerns arise.  I will see her back in 1 month for another treatment-- we will watch this toenail as it grows  Base of nail has 2mm of new nail from the prox nail fold

## 2014-04-22 ENCOUNTER — Encounter: Payer: BC Managed Care – PPO | Admitting: Gynecology

## 2014-04-30 ENCOUNTER — Encounter: Payer: Self-pay | Admitting: Podiatrist

## 2014-04-30 ENCOUNTER — Ambulatory Visit (INDEPENDENT_AMBULATORY_CARE_PROVIDER_SITE_OTHER): Payer: BC Managed Care – PPO | Admitting: Podiatrist

## 2014-04-30 VITALS — BP 120/72 | HR 60 | Resp 12

## 2014-04-30 DIAGNOSIS — B351 Tinea unguium: Secondary | ICD-10-CM

## 2014-04-30 MED ORDER — TAVABOROLE 5 % EX SOLN: 1.0000 [drp] | CUTANEOUS | Status: DC

## 2014-04-30 NOTE — Progress Notes (Signed)
Patient presents today with her daughter for continued laser treatment on the left great toe. We've been treating her left hallux nail which has been growing out well and there continues to be a definitive line of new growth noted at today's visit.  Laserering of Toenails left First was carried out at today's visit via Q-switch YAG laser by [employer] at continuous  Patient and staff were wearing appropriate laser protective goggles/eyewear  Laser device was tested prior to use and safety protocols were followed  Frequency of 5 Hz, Level 4, Joules 7.5 delivered.  The patient tolerated the lasering well and will call if any concerns arise    I will see her back in 2 month for another treatment-- we will watch this toenail as it grows -  Will keep her on topical kerydin for the nail fungus as well Base of nail has 3mm of new nail from the prox nail fold

## 2014-06-09 ENCOUNTER — Encounter: Payer: BC Managed Care – PPO | Admitting: Gynecology

## 2014-06-15 ENCOUNTER — Encounter: Payer: BC Managed Care – PPO | Admitting: Gynecology

## 2014-06-19 ENCOUNTER — Encounter: Payer: Self-pay | Admitting: *Deleted

## 2014-07-14 ENCOUNTER — Encounter: Payer: BC Managed Care – PPO | Admitting: Gynecology

## 2014-07-16 ENCOUNTER — Encounter: Payer: Self-pay | Admitting: Podiatrist

## 2014-07-16 ENCOUNTER — Ambulatory Visit: Payer: BC Managed Care – PPO | Admitting: Podiatrist

## 2014-07-16 ENCOUNTER — Ambulatory Visit (INDEPENDENT_AMBULATORY_CARE_PROVIDER_SITE_OTHER): Payer: BC Managed Care – PPO | Admitting: Podiatrist

## 2014-07-16 VITALS — BP 102/62 | HR 64 | Resp 18

## 2014-07-16 DIAGNOSIS — B351 Tinea unguium: Secondary | ICD-10-CM

## 2014-07-16 MED ORDER — EFINACONAZOLE 10 % EX SOLN: 1.0000 [drp] | Freq: Every day | CUTANEOUS | Status: DC

## 2014-07-18 NOTE — Progress Notes (Signed)
Patient presents today with her daughter for continued laser treatment on the left great toe. We've been treating her left hallux nail which has been growing out well and there continues to be a definitive line of new growth noted at today's visit.  Laserering of Toenails left First was carried out at today's visit via Q-switch YAG laser by [employer] at continuous  Patient and staff were wearing appropriate laser protective goggles/eyewear  Laser device was tested prior to use and safety protocols were followed  Frequency of 5 Hz, Level 4, Joules 7.5 delivered.  The patient tolerated the lasering well and will call if any concerns arise  I will see her back in 2 month for another treatment-- we will watch this toenail as it grows - Will keep her on topical kerydin for the nail fungus as well  Base of nail has 4mm of new nail from the prox nail fold

## 2014-08-20 ENCOUNTER — Encounter: Payer: BC Managed Care – PPO | Admitting: Gynecology

## 2014-09-07 ENCOUNTER — Encounter: Payer: BC Managed Care – PPO | Admitting: Gynecology

## 2014-09-17 ENCOUNTER — Ambulatory Visit (INDEPENDENT_AMBULATORY_CARE_PROVIDER_SITE_OTHER): Payer: BC Managed Care – PPO | Admitting: Podiatrist

## 2014-09-17 ENCOUNTER — Encounter: Payer: Self-pay | Admitting: Podiatrist

## 2014-09-17 DIAGNOSIS — B351 Tinea unguium: Secondary | ICD-10-CM

## 2014-09-17 NOTE — Progress Notes (Signed)
Patient presents today  for continued laser treatment on the left great toe. We've been treating her left hallux nail which has been growing out well and there continues to be a definitive line of new growth noted at today's visit.  Laserering of Toenails left First was carried out at today's visit via Q-switch YAG laser by [employer] at continuous  Patient and staff were wearing appropriate laser protective goggles/eyewear  Laser device was tested prior to use and safety protocols were followed  Frequency of 5 Hz, Level 4, Joules 7.5 delivered.  The patient tolerated the lasering well and will call if any concerns arise  I will see her back in 2 month for another treatment-- we will watch this toenail as it grows - Will keep her on topical kerydin for the nail fungus as well  Base of nail has 4mm of new nail from the prox nail fold

## 2014-09-20 ENCOUNTER — Encounter: Payer: Self-pay | Admitting: Podiatrist

## 2014-11-24 ENCOUNTER — Encounter: Payer: BC Managed Care – PPO | Admitting: Gynecology

## 2014-11-26 ENCOUNTER — Ambulatory Visit: Payer: BC Managed Care – PPO | Admitting: Podiatrist

## 2014-12-03 ENCOUNTER — Ambulatory Visit (INDEPENDENT_AMBULATORY_CARE_PROVIDER_SITE_OTHER): Payer: BLUE CROSS/BLUE SHIELD | Admitting: Podiatrist

## 2014-12-03 DIAGNOSIS — B351 Tinea unguium: Secondary | ICD-10-CM

## 2014-12-03 NOTE — Progress Notes (Signed)
Patient presents today  for continued laser treatment on the left great toe. We've been treating her left hallux nail which has been growing out well and there continues to be a definitive line of new growth noted at today's visit. Significant improvement reported   Laserering of Toenails left First was carried out at today's visit via Q-switch YAG laser by [employer] at continuous  Patient and staff were wearing appropriate laser protective goggles/eyewear  Laser device was tested prior to use and safety protocols were followed  Frequency of 5 Hz, Level 4, Joules 7.5 delivered.   The patient tolerated the lasering well and will call if any concerns arise  At this point the toenail should continue to grow out normally. If she notices any increase in discoloration of the nail she will call.

## 2014-12-30 ENCOUNTER — Other Ambulatory Visit (HOSPITAL_COMMUNITY)
Admission: RE | Admit: 2014-12-30 | Discharge: 2014-12-30 | Disposition: A | Discharge status: Home / Self Care | Payer: BLUE CROSS/BLUE SHIELD | Source: Ambulatory Visit | Attending: Gynecology | Admitting: Gynecology

## 2014-12-30 ENCOUNTER — Encounter: Payer: Self-pay | Admitting: Gynecology

## 2014-12-30 ENCOUNTER — Ambulatory Visit (INDEPENDENT_AMBULATORY_CARE_PROVIDER_SITE_OTHER): Payer: BLUE CROSS/BLUE SHIELD | Admitting: Gynecology

## 2014-12-30 VITALS — BP 114/64 | Ht 68.5 in | Wt 155.0 lb

## 2014-12-30 DIAGNOSIS — Z1151 Encounter for screening for human papillomavirus (HPV): Secondary | ICD-10-CM | POA: Insufficient documentation

## 2014-12-30 DIAGNOSIS — Z01419 Encounter for gynecological examination (general) (routine) without abnormal findings: Secondary | ICD-10-CM | POA: Diagnosis not present

## 2014-12-30 DIAGNOSIS — L659 Nonscarring hair loss, unspecified: Secondary | ICD-10-CM

## 2014-12-30 LAB — COMPREHENSIVE METABOLIC PANEL
ALBUMIN: 3.6 g/dL (ref 3.5–5.2)
ALT: 14 U/L (ref 0–35)
AST: 18 U/L (ref 0–37)
Alkaline Phosphatase: 40 U/L (ref 39–117)
BILIRUBIN TOTAL: 0.7 mg/dL (ref 0.2–1.2)
BUN: 10 mg/dL (ref 6–23)
CO2: 29 meq/L (ref 19–32)
Calcium: 8.8 mg/dL (ref 8.4–10.5)
Chloride: 101 mEq/L (ref 96–112)
Creat: 0.62 mg/dL (ref 0.50–1.10)
GLUCOSE: 75 mg/dL (ref 70–99)
POTASSIUM: 3.7 meq/L (ref 3.5–5.3)
Sodium: 138 mEq/L (ref 135–145)
TOTAL PROTEIN: 6.3 g/dL (ref 6.0–8.3)

## 2014-12-30 LAB — CBC WITH DIFFERENTIAL/PLATELET
BASOS ABS: 0 10*3/uL (ref 0.0–0.1)
Basophils Relative: 0 % (ref 0–1)
Eosinophils Absolute: 0.1 10*3/uL (ref 0.0–0.7)
Eosinophils Relative: 2 % (ref 0–5)
HEMATOCRIT: 36.5 % (ref 36.0–46.0)
Hemoglobin: 12.1 g/dL (ref 12.0–15.0)
LYMPHS PCT: 22 % (ref 12–46)
Lymphs Abs: 1.6 10*3/uL (ref 0.7–4.0)
MCH: 29.5 pg (ref 26.0–34.0)
MCHC: 33.2 g/dL (ref 30.0–36.0)
MCV: 89 fL (ref 78.0–100.0)
MONO ABS: 0.4 10*3/uL (ref 0.1–1.0)
MPV: 10.8 fL (ref 8.6–12.4)
Monocytes Relative: 6 % (ref 3–12)
NEUTROS ABS: 5.2 10*3/uL (ref 1.7–7.7)
Neutrophils Relative %: 70 % (ref 43–77)
PLATELETS: 238 10*3/uL (ref 150–400)
RBC: 4.1 MIL/uL (ref 3.87–5.11)
RDW: 12.5 % (ref 11.5–15.5)
WBC: 7.4 10*3/uL (ref 4.0–10.5)

## 2014-12-30 LAB — LIPID PANEL
CHOL/HDL RATIO: 2.6 ratio
Cholesterol: 141 mg/dL (ref 0–200)
HDL: 55 mg/dL (ref >39)
LDL Cholesterol: 67 mg/dL (ref 0–99)
Triglycerides: 93 mg/dL (ref <150)
VLDL: 19 mg/dL (ref 0–40)

## 2014-12-30 NOTE — Progress Notes (Signed)
Andrea RomansSarah G [name] 05/17/1971 [id number]        44 y.o.  G3P3003 for annual exam.  Doing well. Several issues noted below.  Past medical history,surgical history, problem list, medications, allergies, family history and social history were all reviewed and documented as reviewed in the EPIC chart.  ROS:  Performed with pertinent positives and negatives included in the history, assessment and plan.   Additional significant findings :  Generalized hair loss as discussed below.   Exam: Kim assistant Filed Vitals:   12/30/14 1202  BP: 114/64  Height: 5' 8.5" (1.74 m)  Weight: 155 lb (70.308 kg)   General appearance:  Normal affect, orientation and appearance. Skin: Grossly normal HEENT: Without gross lesions.  No cervical or supraclavicular adenopathy. Thyroid normal.  Lungs:  Clear without wheezing, rales or rhonchi Cardiac: RR, without RMG Abdominal:  Soft, nontender, without masses, guarding, rebound, organomegaly or hernia Breasts:  Examined lying and sitting without masses, retractions, discharge or axillary adenopathy. Pelvic:  Ext/BUS/vagina normal  Cervix normal. Pap/HPV  Uterus anteverted, normal size, shape and contour, midline and mobile nontender   Adnexa  Without masses or tenderness    Anus and perineum  Normal   Rectovaginal  Normal sphincter tone without palpated masses or tenderness.    Assessment/Plan:  44 y.o. 13P3003 female for annual exam with regular light menses, tubal sterilization.   1. History of menorrhagia. Endometrial ablation, [facility] 2014. Doing well with lighter menses. Continue to monitor. 2. Generalized alopecia. Patient knows of the past year some increased hair loss. No frank balding areas. No skin weight or other symptoms. Check thyroid panel and CBC for iron. Assuming negative monitor at present. Refer to dermatology if persists or worsens. 3. Pap smear 2012. Pap/HPV today. No history of significant abnormal Pap smears. 4. Mammogram never.  Recommended screening mammogram now she agrees to schedule. Names and numbers provided. 5. Health maintenance. Patient is fasting. CBC, comprehensive metabolic panel, lipid profile, urinalysis along with thyroid panel ordered.     Dara LordsFONTAINE,Cypher Paule P MD, 12:33 PM 12/30/2014

## 2014-12-30 NOTE — Addendum Note (Signed)
Addended by: Dayna BarkerGARDNER, KIMBERLY K on: 12/30/2014 12:44 PM   Modules accepted: Orders

## 2014-12-30 NOTE — Patient Instructions (Addendum)
Call to Schedule your mammogram  Facilities in [place]: 1)  [facility], [address] Phone: [phone] 2)  [facility], [address] Phone: [phone] 3)  Dr. Bertrand at [facility]  [address] Phone: [phone]     Mammogram A mammogram is an X-ray test to find changes in a woman's breast. You should get a mammogram if:  You are 40 years of age or older  You have risk factors.   Your doctor recommends that you have one.  BEFORE THE TEST  Do not schedule the test the week before your period, especially if your breasts are sore during this time.  On the day of your mammogram:  Wash your breasts and armpits well. After washing, do not put on any deodorant or talcum powder on until after your test.   Eat and drink as you usually do.   Take your medicines as usual.   If you are diabetic and take insulin, make sure you:   Eat before coming for your test.   Take your insulin as usual.   If you cannot keep your appointment, call before the appointment to cancel. Schedule another appointment.  TEST  You will need to undress from the waist up. You will put on a hospital gown.   Your breast will be put on the mammogram machine, and it will press firmly on your breast with a piece of plastic called a compression paddle. This will make your breast flatter so that the machine can X-ray all parts of your breast.   Both breasts will be X-rayed. Each breast will be X-rayed from above and from the side. An X-ray might need to be taken again if the picture is not good enough.   The mammogram will last about 15 to 30 minutes.  AFTER THE TEST Finding out the results of your test Ask when your test results will be ready. Make sure you get your test results.  Document Released: 02/01/2009 Document Revised: 10/25/2011 Document Reviewed: 02/01/2009 ExitCare Patient  Information 2012 [employer].  You may obtain a copy of any labs that were done today by logging onto MyChart as outlined in the instructions provided with your AVS (after visit summary). The office will not call with normal lab results but certainly if there are any significant abnormalities then we will contact you.   Health Maintenance, Female A healthy lifestyle and preventative care can promote health and wellness.  Maintain regular health, dental, and eye exams.  Eat a healthy diet. Foods like vegetables, fruits, whole grains, low-fat dairy products, and lean protein foods contain the nutrients you need without too many calories. Decrease your intake of foods high in solid fats, added sugars, and salt. Get information about a proper diet from your caregiver, if necessary.  Regular physical exercise is one of the most important things you can do for your health. Most adults should get at least 150 minutes of moderate-intensity exercise (any activity that increases your heart rate and causes you to sweat) each week. In addition, most adults need muscle-strengthening exercises on 2 or more days a week.   Maintain a healthy weight. The body mass index (BMI) is a screening tool to identify possible weight problems. It provides an estimate of body fat based on height and weight. Your caregiver can help determine your BMI, and can help you achieve or maintain a healthy weight. For adults   20 years and older:  A BMI below 18.5 is considered underweight.  A BMI of 18.5 to 24.9 is normal.  A BMI of 25 to 29.9 is considered overweight.  A BMI of 30 and above is considered obese.  Maintain normal blood lipids and cholesterol by exercising and minimizing your intake of saturated fat. Eat a balanced diet with plenty of fruits and vegetables. Blood tests for lipids and cholesterol should begin at age 20 and be repeated every 5 years. If your lipid or cholesterol levels are high, you are over 50, or  you are a high risk for heart disease, you may need your cholesterol levels checked more frequently.Ongoing high lipid and cholesterol levels should be treated with medicines if diet and exercise are not effective.  If you smoke, find out from your caregiver how to quit. If you do not use tobacco, do not start.  Lung cancer screening is recommended for adults aged 55 80 years who are at high risk for developing lung cancer because of a history of smoking. Yearly low-dose computed tomography (CT) is recommended for people who have at least a 30-pack-year history of smoking and are a current smoker or have quit within the past 15 years. A pack year of smoking is smoking an average of 1 pack of cigarettes a day for 1 year (for example: 1 pack a day for 30 years or 2 packs a day for 15 years). Yearly screening should continue until the smoker has stopped smoking for at least 15 years. Yearly screening should also be stopped for people who develop a health problem that would prevent them from having lung cancer treatment.  If you are pregnant, do not drink alcohol. If you are breastfeeding, be very cautious about drinking alcohol. If you are not pregnant and choose to drink alcohol, do not exceed 1 drink per day. One drink is considered to be 12 ounces (355 mL) of beer, 5 ounces (148 mL) of wine, or 1.5 ounces (44 mL) of liquor.  Avoid use of street drugs. Do not share needles with anyone. Ask for help if you need support or instructions about stopping the use of drugs.  High blood pressure causes heart disease and increases the risk of stroke. Blood pressure should be checked at least every 1 to 2 years. Ongoing high blood pressure should be treated with medicines, if weight loss and exercise are not effective.  If you are 55 to 42 years old, ask your caregiver if you should take aspirin to prevent strokes.  Diabetes screening involves taking a blood sample to check your fasting blood sugar level. This  should be done once every 3 years, after age 45, if you are within normal weight and without risk factors for diabetes. Testing should be considered at a younger age or be carried out more frequently if you are overweight and have at least 1 risk factor for diabetes.  Breast cancer screening is essential preventative care for women. You should practice "breast self-awareness." This means understanding the normal appearance and feel of your breasts and may include breast self-examination. Any changes detected, no matter how small, should be reported to a caregiver. Women in their 20s and 30s should have a clinical breast exam (CBE) by a caregiver as part of a regular health exam every 1 to 3 years. After age 40, women should have a CBE every year. Starting at age 40, women should consider having a mammogram (breast X-ray) every year. Women who have a   family history of breast cancer should talk to their caregiver about genetic screening. Women at a high risk of breast cancer should talk to their caregiver about having an MRI and a mammogram every year.  Breast cancer gene (BRCA)-related cancer risk assessment is recommended for women who have family members with BRCA-related cancers. BRCA-related cancers include breast, ovarian, tubal, and peritoneal cancers. Having family members with these cancers may be associated with an increased risk for harmful changes (mutations) in the breast cancer genes BRCA1 and BRCA2. Results of the assessment will determine the need for genetic counseling and BRCA1 and BRCA2 testing.  The Pap test is a screening test for cervical cancer. Women should have a Pap test starting at age 21. Between ages 21 and 29, Pap tests should be repeated every 2 years. Beginning at age 30, you should have a Pap test every 3 years as long as the past 3 Pap tests have been normal. If you had a hysterectomy for a problem that was not cancer or a condition that could lead to cancer, then you no longer  need Pap tests. If you are between ages 65 and 70, and you have had normal Pap tests going back 10 years, you no longer need Pap tests. If you have had past treatment for cervical cancer or a condition that could lead to cancer, you need Pap tests and screening for cancer for at least 20 years after your treatment. If Pap tests have been discontinued, risk factors (such as a new sexual partner) need to be reassessed to determine if screening should be resumed. Some women have medical problems that increase the chance of getting cervical cancer. In these cases, your caregiver may recommend more frequent screening and Pap tests.  The human papillomavirus (HPV) test is an additional test that may be used for cervical cancer screening. The HPV test looks for the virus that can cause the cell changes on the cervix. The cells collected during the Pap test can be tested for HPV. The HPV test could be used to screen women aged 30 years and older, and should be used in women of any age who have unclear Pap test results. After the age of 30, women should have HPV testing at the same frequency as a Pap test.  Colorectal cancer can be detected and often prevented. Most routine colorectal cancer screening begins at the age of 50 and continues through age 75. However, your caregiver may recommend screening at an earlier age if you have risk factors for colon cancer. On a yearly basis, your caregiver may provide home test kits to check for hidden blood in the stool. Use of a small camera at the end of a tube, to directly examine the colon (sigmoidoscopy or colonoscopy), can detect the earliest forms of colorectal cancer. Talk to your caregiver about this at age 50, when routine screening begins. Direct examination of the colon should be repeated every 5 to 10 years through age 75, unless early forms of pre-cancerous polyps or small growths are found.  Hepatitis C blood testing is recommended for all people born from 1945  through 1965 and any individual with known risks for hepatitis C.  Practice safe sex. Use condoms and avoid high-risk sexual practices to reduce the spread of sexually transmitted infections (STIs). Sexually active women aged 25 and younger should be checked for Chlamydia, which is a common sexually transmitted infection. Older women with new or multiple partners should also be tested for Chlamydia. Testing for   other STIs is recommended if you are sexually active and at increased risk.  Osteoporosis is a disease in which the bones lose minerals and strength with aging. This can result in serious bone fractures. The risk of osteoporosis can be identified using a bone density scan. Women ages 65 and over and women at risk for fractures or osteoporosis should discuss screening with their caregivers. Ask your caregiver whether you should be taking a calcium supplement or vitamin D to reduce the rate of osteoporosis.  Menopause can be associated with physical symptoms and risks. Hormone replacement therapy is available to decrease symptoms and risks. You should talk to your caregiver about whether hormone replacement therapy is right for you.  Use sunscreen. Apply sunscreen liberally and repeatedly throughout the day. You should seek shade when your shadow is shorter than you. Protect yourself by wearing long sleeves, pants, a wide-brimmed hat, and sunglasses year round, whenever you are outdoors.  Notify your caregiver of new moles or changes in moles, especially if there is a change in shape or color. Also notify your caregiver if a mole is larger than the size of a pencil eraser.  Stay current with your immunizations. Document Released: 05/21/2011 Document Revised: 03/02/2013 Document Reviewed: 05/21/2011 ExitCare Patient Information 2014 [employer].   

## 2014-12-31 ENCOUNTER — Other Ambulatory Visit: Payer: Self-pay | Admitting: Gynecology

## 2014-12-31 DIAGNOSIS — R7989 Other specified abnormal findings of blood chemistry: Secondary | ICD-10-CM

## 2014-12-31 LAB — URINALYSIS W MICROSCOPIC + REFLEX CULTURE
BILIRUBIN URINE: NEGATIVE
Bacteria, UA: NONE SEEN
Casts: NONE SEEN
Crystals: NONE SEEN
GLUCOSE, UA: NEGATIVE mg/dL
Hgb urine dipstick: NEGATIVE
Ketones, ur: NEGATIVE mg/dL
Leukocytes, UA: NEGATIVE
Nitrite: NEGATIVE
PH: 6 (ref 5.0–8.0)
PROTEIN: NEGATIVE mg/dL
SQUAMOUS EPITHELIAL / LPF: NONE SEEN
Specific Gravity, Urine: 1.013 (ref 1.005–1.030)
Urobilinogen, UA: 0.2 mg/dL (ref 0.0–1.0)

## 2014-12-31 LAB — THYROID PANEL
FREE T4: 1.1 ng/dL (ref 0.80–1.80)
T3 Uptake: 28 % (ref 22–35)
T4 TOTAL: 7.3 ug/dL (ref 4.5–12.0)
TSH: 0.035 u[IU]/mL — ABNORMAL LOW (ref 0.350–4.500)

## 2015-01-03 ENCOUNTER — Other Ambulatory Visit: Payer: Self-pay

## 2015-01-03 LAB — CYTOLOGY - PAP

## 2015-01-07 ENCOUNTER — Other Ambulatory Visit: Payer: BLUE CROSS/BLUE SHIELD

## 2015-01-07 DIAGNOSIS — R7989 Other specified abnormal findings of blood chemistry: Secondary | ICD-10-CM

## 2015-01-08 LAB — TSH: TSH: 0.214 u[IU]/mL — ABNORMAL LOW (ref 0.350–4.500)

## 2015-01-11 ENCOUNTER — Telehealth: Payer: Self-pay | Admitting: *Deleted

## 2015-01-11 DIAGNOSIS — E0590 Thyrotoxicosis, unspecified without thyrotoxic crisis or storm: Secondary | ICD-10-CM

## 2015-01-11 NOTE — Telephone Encounter (Signed)
Referral placed for the below her office will contact to schedule.

## 2015-01-11 NOTE — Telephone Encounter (Signed)
-----   Message from Theodoro DoingKatherine Annas, [place] sent at 01/10/2015  4:49 PM EST ----- Regarding: Endocrinology referral Per Dr. Adonis HousekeeperF-"Tell patient that her TSH is still low consistent with hyperthyroid. Recommend appointment with endocrinologist such as Dr. Elvera LennoxGherghe."  I informed patient and that you would be scheduling her and someone would be in touch.

## 2015-01-19 NOTE — Telephone Encounter (Signed)
appointment 01/31/15 @ 8:15am

## 2015-01-31 ENCOUNTER — Ambulatory Visit (INDEPENDENT_AMBULATORY_CARE_PROVIDER_SITE_OTHER): Payer: BLUE CROSS/BLUE SHIELD | Admitting: Internal Medicine

## 2015-01-31 ENCOUNTER — Encounter: Payer: Self-pay | Admitting: Internal Medicine

## 2015-01-31 VITALS — BP 104/62 | HR 64 | Temp 97.8°F | Resp 12 | Ht 68.5 in | Wt 158.2 lb

## 2015-01-31 DIAGNOSIS — E0590 Thyrotoxicosis, unspecified without thyrotoxic crisis or storm: Secondary | ICD-10-CM

## 2015-01-31 LAB — T3, FREE: T3 FREE: 3.3 pg/mL (ref 2.3–4.2)

## 2015-01-31 LAB — T4, FREE: Free T4: 0.68 ng/dL (ref 0.60–1.60)

## 2015-01-31 LAB — TSH: TSH: 5.95 u[IU]/mL — ABNORMAL HIGH (ref 0.35–4.50)
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
## 2015-01-31 NOTE — Patient Instructions (Addendum)
  You have subclinical hyperthyroidism.  Please stop at the lab.  If the labs are normal today, we may cancel the next appt and only schedule labs for then.

## 2015-02-02 LAB — THYROID STIMULATING IMMUNOGLOBULIN: TSI: 29 % baseline (ref <140)

## 2015-08-01 ENCOUNTER — Ambulatory Visit: Payer: BLUE CROSS/BLUE SHIELD | Admitting: Internal Medicine

## 2015-08-26 ENCOUNTER — Ambulatory Visit: Payer: BLUE CROSS/BLUE SHIELD | Admitting: Internal Medicine

## 2015-09-01 ENCOUNTER — Other Ambulatory Visit (INDEPENDENT_AMBULATORY_CARE_PROVIDER_SITE_OTHER): Payer: BLUE CROSS/BLUE SHIELD

## 2015-09-01 DIAGNOSIS — E0590 Thyrotoxicosis, unspecified without thyrotoxic crisis or storm: Secondary | ICD-10-CM

## 2015-09-01 LAB — T3, FREE: T3, Free: 3 pg/mL (ref 2.3–4.2)

## 2015-09-01 LAB — TSH: TSH: 2.26 u[IU]/mL (ref 0.35–4.50)

## 2015-09-01 LAB — T4, FREE: Free T4: 0.87 ng/dL (ref 0.60–1.60)

## 2015-09-02 LAB — THYROID PEROXIDASE ANTIBODY: Thyroperoxidase Ab SerPl-aCnc: 2 IU/mL (ref <9)

## 2015-09-05 ENCOUNTER — Encounter: Payer: Self-pay | Admitting: Internal Medicine

## 2015-09-05 ENCOUNTER — Ambulatory Visit (INDEPENDENT_AMBULATORY_CARE_PROVIDER_SITE_OTHER): Payer: BLUE CROSS/BLUE SHIELD | Admitting: Internal Medicine

## 2015-09-05 VITALS — BP 114/62 | HR 72 | Temp 97.5°F | Resp 12 | Wt 163.6 lb

## 2015-09-05 DIAGNOSIS — E0590 Thyrotoxicosis, unspecified without thyrotoxic crisis or storm: Secondary | ICD-10-CM

## 2015-09-05 NOTE — Patient Instructions (Addendum)
You have resolving subacute thyroiditis.  Please schedule a new lab appt in 6-8 weeks.  After this, please have your PCP or ObGyn Dr check your thyroid tests yearly.  Come back to see me as needed.
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
## 2015-10-26 ENCOUNTER — Other Ambulatory Visit (INDEPENDENT_AMBULATORY_CARE_PROVIDER_SITE_OTHER): Payer: BLUE CROSS/BLUE SHIELD

## 2015-10-26 DIAGNOSIS — E0590 Thyrotoxicosis, unspecified without thyrotoxic crisis or storm: Secondary | ICD-10-CM

## 2015-10-26 LAB — T4, FREE: FREE T4: 0.77 ng/dL (ref 0.60–1.60)

## 2015-10-26 LAB — TSH: TSH: 2.34 u[IU]/mL (ref 0.35–4.50)

## 2015-10-26 LAB — T3, FREE: T3, Free: 3.5 pg/mL (ref 2.3–4.2)

## 2016-03-30 ENCOUNTER — Encounter: Payer: BLUE CROSS/BLUE SHIELD | Admitting: Gynecology

## 2016-04-02 ENCOUNTER — Ambulatory Visit (INDEPENDENT_AMBULATORY_CARE_PROVIDER_SITE_OTHER): Payer: Managed Care, Other (non HMO) | Admitting: Gynecology

## 2016-04-02 ENCOUNTER — Encounter: Payer: Self-pay | Admitting: Gynecology

## 2016-04-02 VITALS — BP 112/64 | Ht 69.0 in | Wt 168.0 lb

## 2016-04-02 DIAGNOSIS — Z01419 Encounter for gynecological examination (general) (routine) without abnormal findings: Secondary | ICD-10-CM | POA: Diagnosis not present

## 2016-04-02 LAB — CBC WITH DIFFERENTIAL/PLATELET
BASOS PCT: 0 %
Basophils Absolute: 0 cells/uL (ref 0–200)
EOS PCT: 1 %
Eosinophils Absolute: 93 cells/uL (ref 15–500)
HCT: 38.5 % (ref 35.0–45.0)
Hemoglobin: 12.6 g/dL (ref 11.7–15.5)
LYMPHS ABS: 1488 {cells}/uL (ref 850–3900)
Lymphocytes Relative: 16 %
MCH: 29.9 pg (ref 27.0–33.0)
MCHC: 32.7 g/dL (ref 32.0–36.0)
MCV: 91.2 fL (ref 80.0–100.0)
MONOS PCT: 6 %
MPV: 10.9 fL (ref 7.5–12.5)
Monocytes Absolute: 558 cells/uL (ref 200–950)
NEUTROS ABS: 7161 {cells}/uL (ref 1500–7800)
Neutrophils Relative %: 77 %
PLATELETS: 223 10*3/uL (ref 140–400)
RBC: 4.22 MIL/uL (ref 3.80–5.10)
RDW: 12.8 % (ref 11.0–15.0)
WBC: 9.3 10*3/uL (ref 3.8–10.8)

## 2016-04-02 LAB — COMPREHENSIVE METABOLIC PANEL
ALT: 8 U/L (ref 6–29)
AST: 14 U/L (ref 10–30)
Albumin: 4 g/dL (ref 3.6–5.1)
Alkaline Phosphatase: 40 U/L (ref 33–115)
BUN: 14 mg/dL (ref 7–25)
CO2: 24 mmol/L (ref 20–31)
CREATININE: 0.79 mg/dL (ref 0.50–1.10)
Calcium: 8.9 mg/dL (ref 8.6–10.2)
Chloride: 103 mmol/L (ref 98–110)
GLUCOSE: 82 mg/dL (ref 65–99)
Potassium: 3.8 mmol/L (ref 3.5–5.3)
SODIUM: 137 mmol/L (ref 135–146)
Total Bilirubin: 0.6 mg/dL (ref 0.2–1.2)
Total Protein: 6.6 g/dL (ref 6.1–8.1)

## 2016-04-02 NOTE — Patient Instructions (Signed)
Call to Schedule your mammogram  Facilities in [place]: 1)  [facility]. [address] [place] Phone: [phone] 2)  Dr. Isaiah Blakes at [facility] [address] Phone: [phone]     Mammogram A mammogram is an X-ray test to find changes in a woman's breast. You should get a mammogram if:  You are 43 years of age or older  You have risk factors.   Your doctor recommends that you have one.  BEFORE THE TEST  Do not schedule the test the week before your period, especially if your breasts are sore during this time.  On the day of your mammogram:  Wash your breasts and armpits well. After washing, do not put on any deodorant or talcum powder on until after your test.   Eat and drink as you usually do.   Take your medicines as usual.   If you are diabetic and take insulin, make sure you:   Eat before coming for your test.   Take your insulin as usual.   If you cannot keep your appointment, call before the appointment to cancel. Schedule another appointment.  TEST  You will need to undress from the waist up. You will put on a hospital gown.   Your breast will be put on the mammogram machine, and it will press firmly on your breast with a piece of plastic called a compression paddle. This will make your breast flatter so that the machine can X-ray all parts of your breast.   Both breasts will be X-rayed. Each breast will be X-rayed from above and from the side. An X-ray might need to be taken again if the picture is not good enough.   The mammogram will last about 15 to 30 minutes.  AFTER THE TEST Finding out the results of your test Ask when your test results will be ready. Make sure you get your test results.  Document Released: 02/01/2009 Document Revised: 10/25/2011 Document Reviewed: 02/01/2009 [facility] Patient Information 2012 [place].  You may obtain a copy of any labs that were  done today by logging onto MyChart as outlined in the instructions provided with your AVS (after visit summary). The office will not call with normal lab results but certainly if there are any significant abnormalities then we will contact you.   Health Maintenance Adopting a healthy lifestyle and getting preventive care can go a long way to promote health and wellness. Talk with your health care provider about what schedule of regular examinations is right for you. This is a good chance for you to check in with your provider about disease prevention and staying healthy. In between checkups, there are plenty of things you can do on your own. Experts have done a lot of research about which lifestyle changes and preventive measures are most likely to keep you healthy. Ask your health care provider for more information. WEIGHT AND DIET  Eat a healthy diet  Be sure to include plenty of vegetables, fruits, low-fat dairy products, and lean protein.  Do not eat a lot of foods high in solid fats, added sugars, or salt.  Get regular exercise. This is one of the most important things you can do for your health.  Most adults should exercise for at least 150 minutes each week. The exercise should increase your heart rate and make you sweat (moderate-intensity exercise).  Most adults should also do strengthening exercises at least twice a  week. This is in addition to the moderate-intensity exercise.  Maintain a healthy weight  Body mass index (BMI) is a measurement that can be used to identify possible weight problems. It estimates body fat based on height and weight. Your health care provider can help determine your BMI and help you achieve or maintain a healthy weight.  For females 20 years of age and older:   A BMI below 18.5 is considered underweight.  A BMI of 18.5 to 24.9 is normal.  A BMI of 25 to 29.9 is considered overweight.  A BMI of 30 and above is considered obese.  Watch levels of  cholesterol and blood lipids  You should start having your blood tested for lipids and cholesterol at 43 years of age, then have this test every 5 years.  You may need to have your cholesterol levels checked more often if:  Your lipid or cholesterol levels are high.  You are older than 43 years of age.  You are at high risk for heart disease.  CANCER SCREENING   Lung Cancer  Lung cancer screening is recommended for adults 55-80 years old who are at high risk for lung cancer because of a history of smoking.  A yearly low-dose CT scan of the lungs is recommended for people who:  Currently smoke.  Have quit within the past 15 years.  Have at least a 30-pack-year history of smoking. A pack year is smoking an average of one pack of cigarettes a day for 1 year.  Yearly screening should continue until it has been 15 years since you quit.  Yearly screening should stop if you develop a health problem that would prevent you from having lung cancer treatment.  Breast Cancer  Practice breast self-awareness. This means understanding how your breasts normally appear and feel.  It also means doing regular breast self-exams. Let your health care provider know about any changes, no matter how small.  If you are in your 20s or 30s, you should have a clinical breast exam (CBE) by a health care provider every 1-3 years as part of a regular health exam.  If you are 40 or older, have a CBE every year. Also consider having a breast X-ray (mammogram) every year.  If you have a family history of breast cancer, talk to your health care provider about genetic screening.  If you are at high risk for breast cancer, talk to your health care provider about having an MRI and a mammogram every year.  Breast cancer gene (BRCA) assessment is recommended for women who have family members with BRCA-related cancers. BRCA-related cancers include:  Breast.  Ovarian.  Tubal.  Peritoneal  cancers.  Results of the assessment will determine the need for genetic counseling and BRCA1 and BRCA2 testing. Cervical Cancer Routine pelvic examinations to screen for cervical cancer are no longer recommended for nonpregnant women who are considered low risk for cancer of the pelvic organs (ovaries, uterus, and vagina) and who do not have symptoms. A pelvic examination may be necessary if you have symptoms including those associated with pelvic infections. Ask your health care provider if a screening pelvic exam is right for you.   The Pap test is the screening test for cervical cancer for women who are considered at risk.  If you had a hysterectomy for a problem that was not cancer or a condition that could lead to cancer, then you no longer need Pap tests.  If you are older than 65 years, and   you have had normal Pap tests for the past 10 years, you no longer need to have Pap tests.  If you have had past treatment for cervical cancer or a condition that could lead to cancer, you need Pap tests and screening for cancer for at least 20 years after your treatment.  If you no longer get a Pap test, assess your risk factors if they change (such as having a new sexual partner). This can affect whether you should start being screened again.  Some women have medical problems that increase their chance of getting cervical cancer. If this is the case for you, your health care provider may recommend more frequent screening and Pap tests.  The human papillomavirus (HPV) test is another test that may be used for cervical cancer screening. The HPV test looks for the virus that can cause cell changes in the cervix. The cells collected during the Pap test can be tested for HPV.  The HPV test can be used to screen women 63 years of age and older. Getting tested for HPV can extend the interval between normal Pap tests from three to five years.  An HPV test also should be used to screen women of any age who  have unclear Pap test results.  After 43 years of age, women should have HPV testing as often as Pap tests.  Colorectal Cancer  This type of cancer can be detected and often prevented.  Routine colorectal cancer screening usually begins at 43 years of age and continues through 43 years of age.  Your health care provider may recommend screening at an earlier age if you have risk factors for colon cancer.  Your health care provider may also recommend using home test kits to check for hidden blood in the stool.  A small camera at the end of a tube can be used to examine your colon directly (sigmoidoscopy or colonoscopy). This is done to check for the earliest forms of colorectal cancer.  Routine screening usually begins at age 68.  Direct examination of the colon should be repeated every 5-10 years through 43 years of age. However, you may need to be screened more often if early forms of precancerous polyps or small growths are found. Skin Cancer  Check your skin from head to toe regularly.  Tell your health care provider about any new moles or changes in moles, especially if there is a change in a mole's shape or color.  Also tell your health care provider if you have a mole that is larger than the size of a pencil eraser.  Always use sunscreen. Apply sunscreen liberally and repeatedly throughout the day.  Protect yourself by wearing long sleeves, pants, a wide-brimmed hat, and sunglasses whenever you are outside. HEART DISEASE, DIABETES, AND HIGH BLOOD PRESSURE   Have your blood pressure checked at least every 1-2 years. High blood pressure causes heart disease and increases the risk of stroke.  If you are between 42 years and 28 years old, ask your health care provider if you should take aspirin to prevent strokes.  Have regular diabetes screenings. This involves taking a blood sample to check your fasting blood sugar level.  If you are at a normal weight and have a low risk for  diabetes, have this test once every three years after 43 years of age.  If you are overweight and have a high risk for diabetes, consider being tested at a younger age or more often. PREVENTING INFECTION  Hepatitis B  If you have a higher risk for hepatitis B, you should be screened for this virus. You are considered at high risk for hepatitis B if:  You were born in a country where hepatitis B is common. Ask your health care provider which countries are considered high risk.  Your parents were born in a high-risk country, and you have not been immunized against hepatitis B (hepatitis B vaccine).  You have HIV or AIDS.  You use needles to inject street drugs.  You live with someone who has hepatitis B.  You have had sex with someone who has hepatitis B.  You get hemodialysis treatment.  You take certain medicines for conditions, including cancer, organ transplantation, and autoimmune conditions. Hepatitis C  Blood testing is recommended for:  Everyone born from 1945 through 1965.  Anyone with known risk factors for hepatitis C. Sexually transmitted infections (STIs)  You should be screened for sexually transmitted infections (STIs) including gonorrhea and chlamydia if:  You are sexually active and are younger than 43 years of age.  You are older than 43 years of age and your health care provider tells you that you are at risk for this type of infection.  Your sexual activity has changed since you were last screened and you are at an increased risk for chlamydia or gonorrhea. Ask your health care provider if you are at risk.  If you do not have HIV, but are at risk, it may be recommended that you take a prescription medicine daily to prevent HIV infection. This is called pre-exposure prophylaxis (PrEP). You are considered at risk if:  You are sexually active and do not regularly use condoms or know the HIV status of your partner(s).  You take drugs by injection.  You are  sexually active with a partner who has HIV. Talk with your health care provider about whether you are at high risk of being infected with HIV. If you choose to begin PrEP, you should first be tested for HIV. You should then be tested every 3 months for as long as you are taking PrEP.  PREGNANCY   If you are premenopausal and you may become pregnant, ask your health care provider about preconception counseling.  If you may become pregnant, take 400 to 800 micrograms (mcg) of folic acid every day.  If you want to prevent pregnancy, talk to your health care provider about birth control (contraception). OSTEOPOROSIS AND MENOPAUSE   Osteoporosis is a disease in which the bones lose minerals and strength with aging. This can result in serious bone fractures. Your risk for osteoporosis can be identified using a bone density scan.  If you are 65 years of age or older, or if you are at risk for osteoporosis and fractures, ask your health care provider if you should be screened.  Ask your health care provider whether you should take a calcium or vitamin D supplement to lower your risk for osteoporosis.  Menopause may have certain physical symptoms and risks.  Hormone replacement therapy may reduce some of these symptoms and risks. Talk to your health care provider about whether hormone replacement therapy is right for you.  HOME CARE INSTRUCTIONS   Schedule regular health, dental, and eye exams.  Stay current with your immunizations.   Do not use any tobacco products including cigarettes, chewing tobacco, or electronic cigarettes.  If you are pregnant, do not drink alcohol.  If you are breastfeeding, limit how much and how often you drink alcohol.  Limit alcohol   intake to no more than 1 drink per day for nonpregnant women. One drink equals 12 ounces of beer, 5 ounces of wine, or 1 ounces of hard liquor.  Do not use street drugs.  Do not share needles.  Ask your health care provider for  help if you need support or information about quitting drugs.  Tell your health care provider if you often feel depressed.  Tell your health care provider if you have ever been abused or do not feel safe at home. Document Released: 05/21/2011 Document Revised: 03/22/2014 Document Reviewed: 10/07/2013 [employer] Patient Information 2015 [employer]. This information is not intended to replace advice given to you by your health care provider. Make sure you discuss any questions you have with your health care provider.    

## 2016-04-02 NOTE — Progress Notes (Signed)
    Andrea RomansSarah G [name] 07/10/1971 [id number]        45 y.o.  G3P3003  for annual exam.  Doing well.  Past medical history,surgical history, problem list, medications, allergies, family history and social history were all reviewed and documented as reviewed in the EPIC chart.  ROS:  Performed with pertinent positives and negatives included in the history, assessment and plan.   Additional significant findings :  none   Exam: Andrea PortelaKim [name] assistant Filed Vitals:   04/02/16 1125  BP: 112/64  Height: 5\' 9"  (1.753 m)  Weight: 168 lb (76.204 kg)   General appearance:  Normal affect, orientation and appearance. Skin: Grossly normal HEENT: Without gross lesions.  No cervical or supraclavicular adenopathy. Thyroid normal.  Lungs:  Clear without wheezing, rales or rhonchi Cardiac: RR, without RMG Abdominal:  Soft, nontender, without masses, guarding, rebound, organomegaly or hernia Breasts:  Examined lying and sitting without masses, retractions, discharge or axillary adenopathy. Pelvic:  Ext/BUS/vagina normal  Cervix normal  Uterus anteverted, normal size, shape and contour, midline and mobile nontender   Adnexa without masses or tenderness    Anus and perineum normal   Rectovaginal normal sphincter tone without palpated masses or tenderness.    Assessment/Plan:  45 y.o. 253P3003 female for annual exam with regular menses, tubal sterilization.   1. History of HerOption endometrial ablation 2014. Doing well with regular lighter menses. 2. Pap smear/HPV 2016. No Pap smear done today. No history of significant abnormal Pap smears. Plan repeat Pap smear at 5 year interval per current screening guidelines. 3. Mammography never. I again strongly recommended and emphasized the need to schedule a screening mammogram. Names and numbers provided. Patient understands my recommendation. SBE monthly reviewed. 4. History of transient hypo-/hyper thyroid. Saw Dr. Elvera LennoxGherghe and was diagnosed with thyroiditis  which apparently has resolved as her last TSH was normal. 5. Health maintenance. Baseline CBC, comprehensive metabolic panel, urinalysis ordered. Lipid profile last year with cholesterol 141 HDL 55 and LDL 67. Not repeated now. Follow up in one year, sooner as needed.   [name],[name] P MD, 11:47 AM 04/02/2016

## 2016-04-03 LAB — URINALYSIS W MICROSCOPIC + REFLEX CULTURE
Bacteria, UA: NONE SEEN [HPF]
Bilirubin Urine: NEGATIVE
CASTS: NONE SEEN [LPF]
Crystals: NONE SEEN [HPF]
GLUCOSE, UA: NEGATIVE
Ketones, ur: NEGATIVE
Leukocytes, UA: NEGATIVE
Nitrite: NEGATIVE
PH: 6 (ref 5.0–8.0)
Protein, ur: NEGATIVE
RBC / HPF: NONE SEEN RBC/HPF (ref <=2)
SPECIFIC GRAVITY, URINE: 1.012 (ref 1.001–1.035)
Squamous Epithelial / LPF: NONE SEEN [HPF] (ref <=5)
WBC UA: NONE SEEN WBC/HPF (ref <=5)
YEAST: NONE SEEN [HPF]

## 2016-07-18 ENCOUNTER — Other Ambulatory Visit: Payer: Self-pay | Admitting: Gynecology

## 2016-07-18 DIAGNOSIS — Z1231 Encounter for screening mammogram for malignant neoplasm of breast: Secondary | ICD-10-CM

## 2016-08-07 ENCOUNTER — Ambulatory Visit
Admission: RE | Admit: 2016-08-07 | Discharge: 2016-08-07 | Disposition: A | Discharge status: Home / Self Care | Payer: Managed Care, Other (non HMO) | Source: Ambulatory Visit | Attending: Gynecology | Admitting: Gynecology

## 2016-08-07 DIAGNOSIS — Z1231 Encounter for screening mammogram for malignant neoplasm of breast: Secondary | ICD-10-CM

## 2017-04-05 ENCOUNTER — Encounter: Payer: Managed Care, Other (non HMO) | Admitting: Gynecology

## 2017-07-03 ENCOUNTER — Other Ambulatory Visit: Payer: Self-pay | Admitting: Gynecology

## 2017-07-03 DIAGNOSIS — Z1231 Encounter for screening mammogram for malignant neoplasm of breast: Secondary | ICD-10-CM

## 2017-07-10 ENCOUNTER — Encounter: Payer: Managed Care, Other (non HMO) | Admitting: Women's Health

## 2017-08-09 ENCOUNTER — Ambulatory Visit
Admission: RE | Admit: 2017-08-09 | Discharge: 2017-08-09 | Disposition: A | Discharge status: Home / Self Care | Payer: Managed Care, Other (non HMO) | Source: Ambulatory Visit | Attending: Gynecology | Admitting: Gynecology

## 2017-08-09 DIAGNOSIS — Z1231 Encounter for screening mammogram for malignant neoplasm of breast: Secondary | ICD-10-CM

## 2017-08-27 ENCOUNTER — Encounter: Payer: Self-pay | Admitting: Gynecology

## 2017-08-27 ENCOUNTER — Ambulatory Visit (INDEPENDENT_AMBULATORY_CARE_PROVIDER_SITE_OTHER): Payer: Managed Care, Other (non HMO) | Admitting: Gynecology

## 2017-08-27 VITALS — BP 110/70 | Ht 69.0 in | Wt 152.0 lb

## 2017-08-27 DIAGNOSIS — Z01419 Encounter for gynecological examination (general) (routine) without abnormal findings: Secondary | ICD-10-CM

## 2017-08-27 DIAGNOSIS — Z13220 Encounter for screening for lipoid disorders: Secondary | ICD-10-CM | POA: Diagnosis not present

## 2017-08-27 LAB — CBC WITH DIFFERENTIAL/PLATELET
BASOS PCT: 1 %
Basophils Absolute: 60 cells/uL (ref 0–200)
EOS PCT: 1.2 %
Eosinophils Absolute: 72 cells/uL (ref 15–500)
HCT: 38.3 % (ref 35.0–45.0)
Hemoglobin: 12.7 g/dL (ref 11.7–15.5)
Lymphs Abs: 1344 cells/uL (ref 850–3900)
MCH: 29.8 pg (ref 27.0–33.0)
MCHC: 33.2 g/dL (ref 32.0–36.0)
MCV: 89.9 fL (ref 80.0–100.0)
MONOS PCT: 6.6 %
MPV: 11.9 fL (ref 7.5–12.5)
NEUTROS PCT: 68.8 %
Neutro Abs: 4128 cells/uL (ref 1500–7800)
Platelets: 236 10*3/uL (ref 140–400)
RBC: 4.26 10*6/uL (ref 3.80–5.10)
RDW: 11.8 % (ref 11.0–15.0)
TOTAL LYMPHOCYTE: 22.4 %
WBC: 6 10*3/uL (ref 3.8–10.8)
WBCMIX: 396 {cells}/uL (ref 200–950)

## 2017-08-27 LAB — LIPID PANEL
CHOLESTEROL: 157 mg/dL (ref <200)
HDL: 64 mg/dL (ref >50)
LDL Cholesterol (Calc): 77 mg/dL (calc)
NON-HDL CHOLESTEROL (CALC): 93 mg/dL (ref <130)
Total CHOL/HDL Ratio: 2.5 (calc) (ref <5.0)
Triglycerides: 78 mg/dL (ref <150)

## 2017-08-27 LAB — TSH: TSH: 2.25 mIU/L

## 2017-08-27 LAB — COMPREHENSIVE METABOLIC PANEL
AG RATIO: 1.6 (calc) (ref 1.0–2.5)
ALKALINE PHOSPHATASE (APISO): 42 U/L (ref 33–115)
ALT: 8 U/L (ref 6–29)
AST: 13 U/L (ref 10–30)
Albumin: 4.2 g/dL (ref 3.6–5.1)
BILIRUBIN TOTAL: 0.9 mg/dL (ref 0.2–1.2)
BUN: 14 mg/dL (ref 7–25)
CALCIUM: 9.2 mg/dL (ref 8.6–10.2)
CHLORIDE: 102 mmol/L (ref 98–110)
CO2: 30 mmol/L (ref 20–32)
Creat: 0.73 mg/dL (ref 0.50–1.10)
GLOBULIN: 2.6 g/dL (ref 1.9–3.7)
Glucose, Bld: 85 mg/dL (ref 65–99)
Potassium: 4.7 mmol/L (ref 3.5–5.3)
Sodium: 137 mmol/L (ref 135–146)
Total Protein: 6.8 g/dL (ref 6.1–8.1)

## 2017-08-27 NOTE — Patient Instructions (Signed)
Follow up in one year for annual exam 

## 2017-08-27 NOTE — Progress Notes (Signed)
    [name] [birth year] [id number]        46 y.o.  G3P3003 for annual gynecologic exam.   Past medical history,surgical history, problem list, medications, allergies, family history and social history were all reviewed and documented as reviewed in the EPIC chart.  ROS:  Performed with pertinent positives and negatives included in the history, assessment and plan.   Additional significant findings :   None   Exam:  Kennon Portela assistant Vitals:   08/27/17 0804  BP: 110/70  Weight: 152 lb (68.9 kg)  Height:  (1.753 m)   Body mass index is 22.45 kg/m.  General appearance:  Normal affect, orientation and appearance. Skin: Grossly normal HEENT: Without gross lesions.  No cervical or supraclavicular adenopathy. Thyroid normal.  Lungs:  Clear without wheezing, rales or rhonchi Cardiac: RR, without RMG Abdominal:  Soft, nontender, without masses, guarding, rebound, organomegaly or hernia Breasts:  Examined lying and sitting without masses, retractions, discharge or axillary adenopathy. Pelvic:  Ext, BUS, Vagina: Normal  Cervix:  normal  Uterus:  Anteverted, normal size, shape and contour, midline and mobile nontender   Adnexa: Without masses or tenderness    Anus and perineum: Normal   Rectovaginal: Normal sphincter tone without palpated masses or tenderness.    Assessment/Plan:  46 y.o. G78P3003 female for annual gynecologic exam with regular light menses, tubal sterilization.    1. History of HerOption endometrial ablation. Menses continue light and regular. 2. Pap smear/HPV 2016. No Pap smear done today. No history of abnormal Pap smears. Plan repeat Pap smear at 5 year interval per current screening guidelines. 3. Mammography 07/2017. Continue with annual mammography next year. Breast exam normal today. 4. Mother recently diagnosed with ovarian cancer at age 84. No other family members with history of ovarian or breast cancer. Mother is planning genetic testing.  Patient will follow up with these results and if positive she will pursue testing herself. The issues of increased breast surveillance to include annual mammography and MRI, prophylactic mastectomies as well as ovarian surveillance to include ultrasounds, CA-125's or prophylactic salpingo-oophorectomy discussed. Patient prefers to wait for maternal genetic testing and then go from there. 5. Health maintenance. Baseline CBC, CMP, lipid profile, TSH, urinalysis ordered. Follow up in one year, sooner as needed.    [name] MD, 8:52 AM 08/27/2017

## 2017-08-28 LAB — URINALYSIS W MICROSCOPIC + REFLEX CULTURE
BACTERIA UA: NONE SEEN /HPF
Bilirubin Urine: NEGATIVE
GLUCOSE, UA: NEGATIVE
HGB URINE DIPSTICK: NEGATIVE
Hyaline Cast: NONE SEEN /LPF
KETONES UR: NEGATIVE
LEUKOCYTE ESTERASE: NEGATIVE
Nitrites, Initial: NEGATIVE
PH: 7.5 (ref 5.0–8.0)
Protein, ur: NEGATIVE
Specific Gravity, Urine: 1.02 (ref 1.001–1.03)
Squamous Epithelial / LPF: NONE SEEN /HPF (ref <=5)
WBC UA: NONE SEEN /HPF (ref 0–5)

## 2017-08-28 LAB — NO CULTURE INDICATED

## 2018-05-09 ENCOUNTER — Telehealth: Payer: Self-pay | Admitting: *Deleted

## 2018-05-09 ENCOUNTER — Ambulatory Visit (INDEPENDENT_AMBULATORY_CARE_PROVIDER_SITE_OTHER): Payer: Managed Care, Other (non HMO) | Admitting: Gynecology

## 2018-05-09 ENCOUNTER — Encounter: Payer: Self-pay | Admitting: Gynecology

## 2018-05-09 VITALS — BP 118/74

## 2018-05-09 DIAGNOSIS — R2231 Localized swelling, mass and lump, right upper limb: Secondary | ICD-10-CM

## 2018-05-09 NOTE — Telephone Encounter (Signed)
Orders placed at breast center, pt scheduled on 05/12/18 @ 2:00pm, left detailed message on voicemail asked him to call me to confirm she received message.

## 2018-05-09 NOTE — Progress Notes (Signed)
    [name] 11/04/1971 [id number]        47 y.o.  G3P3003 presents having noticed a nodule in her right axilla over the last month or so.  It is not tender or symptomatic at all but she just noticed it on self-exam.  Mammography normal 07/2017.  Past medical history,surgical history, problem list, medications, allergies, family history and social history were all reviewed and documented in the EPIC chart.  Directed ROS with pertinent positives and negatives documented in the history of present illness/assessment and plan.  Exam: Kennon PortelaKim Gardner assistant Vitals:   05/09/18 1220  BP: 118/74   General appearance:  Normal Both breasts examined lying and sitting left without masses, retractions, discharge, adenopathy.  Right without masses, retractions, discharge.  Small ill-defined density right mid axilla difficult to pinpoint no overlying skin changes.  Assessment/Plan:  47 y.o. [id number] with small ill-defined density right axilla.  Reviewed differential to include tissue density, skin related such as sebaceous cyst, lymph node.  Options for management reviewed to include expectant with self exams and as long as remains unchanged to follow versus starting with ultrasound for better definition.  We both agree at this point to go ahead and start with an ultrasound of the axilla through the breast center and she will follow-up for this as arranged.    Dara Lordsimothy P Cloyce Paterson MD, 12:34 PM 05/09/2018

## 2018-05-09 NOTE — Patient Instructions (Signed)
The breast center will call you to arrange for the ultrasound.  If you do not hear from them within the next week or so call our office.

## 2018-05-09 NOTE — Telephone Encounter (Signed)
-----   Message from Dara Lordsimothy P Fontaine, MD sent at 05/09/2018 12:37 PM EDT ----- Set up appointment with the breast center for right axillary ultrasound reference new onset small nodularity on exam difficult to clearly define.  Last mammography 07/2017.

## 2018-05-12 ENCOUNTER — Other Ambulatory Visit: Payer: Managed Care, Other (non HMO)

## 2018-05-12 NOTE — Telephone Encounter (Signed)
Pt aware of time and date

## 2018-08-12 ENCOUNTER — Other Ambulatory Visit: Payer: Self-pay | Admitting: Gynecology

## 2018-08-12 DIAGNOSIS — Z1231 Encounter for screening mammogram for malignant neoplasm of breast: Secondary | ICD-10-CM

## 2018-09-17 ENCOUNTER — Ambulatory Visit
Admission: RE | Admit: 2018-09-17 | Discharge: 2018-09-17 | Disposition: A | Discharge status: Home / Self Care | Payer: Managed Care, Other (non HMO) | Source: Ambulatory Visit | Attending: Gynecology | Admitting: Gynecology

## 2018-09-17 DIAGNOSIS — Z1231 Encounter for screening mammogram for malignant neoplasm of breast: Secondary | ICD-10-CM

## 2018-10-07 ENCOUNTER — Encounter: Payer: Managed Care, Other (non HMO) | Admitting: Gynecology

## 2018-11-26 ENCOUNTER — Ambulatory Visit (INDEPENDENT_AMBULATORY_CARE_PROVIDER_SITE_OTHER): Payer: BLUE CROSS/BLUE SHIELD | Admitting: Gynecology

## 2018-11-26 ENCOUNTER — Encounter: Payer: Self-pay | Admitting: Gynecology

## 2018-11-26 VITALS — BP 118/76 | Ht 69.0 in | Wt 164.0 lb

## 2018-11-26 DIAGNOSIS — Z13220 Encounter for screening for lipoid disorders: Secondary | ICD-10-CM

## 2018-11-26 DIAGNOSIS — Z01419 Encounter for gynecological examination (general) (routine) without abnormal findings: Secondary | ICD-10-CM

## 2018-11-26 LAB — COMPREHENSIVE METABOLIC PANEL
AG Ratio: 1.3 (calc) (ref 1.0–2.5)
ALBUMIN MSPROF: 4 g/dL (ref 3.6–5.1)
ALT: 10 U/L (ref 6–29)
AST: 12 U/L (ref 10–35)
Alkaline phosphatase (APISO): 43 U/L (ref 33–115)
BILIRUBIN TOTAL: 0.7 mg/dL (ref 0.2–1.2)
BUN: 17 mg/dL (ref 7–25)
CO2: 25 mmol/L (ref 20–32)
Calcium: 8.9 mg/dL (ref 8.6–10.2)
Chloride: 103 mmol/L (ref 98–110)
Creat: 0.67 mg/dL (ref 0.50–1.10)
Globulin: 3 g/dL (calc) (ref 1.9–3.7)
Glucose, Bld: 74 mg/dL (ref 65–99)
POTASSIUM: 3.8 mmol/L (ref 3.5–5.3)
SODIUM: 138 mmol/L (ref 135–146)
TOTAL PROTEIN: 7 g/dL (ref 6.1–8.1)

## 2018-11-26 LAB — CBC WITH DIFFERENTIAL/PLATELET
Absolute Monocytes: 483 cells/uL (ref 200–950)
Basophils Absolute: 62 cells/uL (ref 0–200)
Basophils Relative: 0.9 %
EOS PCT: 1.6 %
Eosinophils Absolute: 110 cells/uL (ref 15–500)
HEMATOCRIT: 36.4 % (ref 35.0–45.0)
HEMOGLOBIN: 12.4 g/dL (ref 11.7–15.5)
LYMPHS ABS: 1539 {cells}/uL (ref 850–3900)
MCH: 30.8 pg (ref 27.0–33.0)
MCHC: 34.1 g/dL (ref 32.0–36.0)
MCV: 90.5 fL (ref 80.0–100.0)
MPV: 11.8 fL (ref 7.5–12.5)
Monocytes Relative: 7 %
NEUTROS ABS: 4706 {cells}/uL (ref 1500–7800)
NEUTROS PCT: 68.2 %
Platelets: 269 10*3/uL (ref 140–400)
RBC: 4.02 10*6/uL (ref 3.80–5.10)
RDW: 11.7 % (ref 11.0–15.0)
Total Lymphocyte: 22.3 %
WBC: 6.9 10*3/uL (ref 3.8–10.8)

## 2018-11-26 LAB — LIPID PANEL
CHOL/HDL RATIO: 3.1 (calc) (ref <5.0)
Cholesterol: 182 mg/dL (ref <200)
HDL: 59 mg/dL (ref >50)
LDL CHOLESTEROL (CALC): 102 mg/dL — AB
NON-HDL CHOLESTEROL (CALC): 123 mg/dL (ref <130)
Triglycerides: 110 mg/dL (ref <150)

## 2018-11-26 NOTE — Patient Instructions (Signed)
Follow-up in 1 year for annual exam, sooner if any issues. 

## 2018-11-26 NOTE — Progress Notes (Signed)
    [name] September 20, 1971 [id number]        48 y.o.  [id number] for annual gynecologic exam.  Without gynecologic complaints.  Past medical history,surgical history, problem list, medications, allergies, family history and social history were all reviewed and documented as reviewed in the EPIC chart.  ROS:  Performed with pertinent positives and negatives included in the history, assessment and plan.   Additional significant findings : None   Exam: Kennon Portela assistant Vitals:   11/26/18 1013  BP: 118/76  Weight: 164 lb (74.4 kg)  Height: 5\' 9"  (1.753 m)   Body mass index is 24.22 kg/m.  General appearance:  Normal affect, orientation and appearance. Skin: Grossly normal HEENT: Without gross lesions.  No cervical or supraclavicular adenopathy. Thyroid normal.  Lungs:  Clear without wheezing, rales or rhonchi Cardiac: RR, without RMG Abdominal:  Soft, nontender, without masses, guarding, rebound, organomegaly or hernia Breasts:  Examined lying and sitting without masses, retractions, discharge or axillary adenopathy. Pelvic:  Ext, BUS, Vagina: Normal  Cervix: Normal  Uterus: Anteverted, normal size, shape and contour, midline and mobile nontender   Adnexa: Without masses or tenderness    Anus and perineum: Normal   Rectovaginal: Normal sphincter tone without palpated masses or tenderness.    Assessment/Plan:  48 y.o. G99P3003 female for annual gynecologic exam with regular light menses status post HerOption endometrial ablation in the past, tubal sterilization.   1. History of right axillary ill-defined area noted 04/2018.  We had ordered an ultrasound but she never followed up for this because of expense.  Has had a follow-up mammogram 08/2018 which was normal.  She notes the area she feels has not changed at all in the interval and prefers just to monitor.  On exam today I am unable to feel any abnormalities in her right axilla.  She is going to continue with self exams and as  long as this area does not change she will monitor at her choice.  She will continue with annual mammography next October. 2. Pap smear/HPV 12/2014.  No Pap smear done today.  No history of abnormal Pap smears.  We will plan repeat Pap smear next year at 5-year interval per current screening guidelines. 3. Mother diagnosed with ovarian cancer at age 57.  The patient reports the mother underwent genetic testing which was negative.  She has no other family members with ovarian or breast cancer.  Her oncologist recommended no further screening/testing needed for my patient. 4. Health maintenance.  Baseline CBC, CMP and lipid profile ordered.  Follow-up 1 year, sooner as needed.   [name] MD, 10:53 AM 11/26/2018

## 2018-12-09 DIAGNOSIS — M7501 Adhesive capsulitis of right shoulder: Secondary | ICD-10-CM | POA: Diagnosis not present

## 2018-12-23 DIAGNOSIS — M7501 Adhesive capsulitis of right shoulder: Secondary | ICD-10-CM | POA: Diagnosis not present

## 2018-12-30 DIAGNOSIS — M7501 Adhesive capsulitis of right shoulder: Secondary | ICD-10-CM | POA: Diagnosis not present

## 2019-01-01 DIAGNOSIS — M7501 Adhesive capsulitis of right shoulder: Secondary | ICD-10-CM | POA: Diagnosis not present

## 2019-01-06 DIAGNOSIS — M7501 Adhesive capsulitis of right shoulder: Secondary | ICD-10-CM | POA: Diagnosis not present

## 2019-01-13 DIAGNOSIS — M7501 Adhesive capsulitis of right shoulder: Secondary | ICD-10-CM | POA: Diagnosis not present

## 2019-01-15 DIAGNOSIS — M7501 Adhesive capsulitis of right shoulder: Secondary | ICD-10-CM | POA: Diagnosis not present

## 2019-01-19 DIAGNOSIS — M7501 Adhesive capsulitis of right shoulder: Secondary | ICD-10-CM | POA: Diagnosis not present

## 2019-01-23 DIAGNOSIS — M7501 Adhesive capsulitis of right shoulder: Secondary | ICD-10-CM | POA: Diagnosis not present

## 2019-01-27 DIAGNOSIS — M7501 Adhesive capsulitis of right shoulder: Secondary | ICD-10-CM | POA: Diagnosis not present

## 2019-01-29 DIAGNOSIS — M7501 Adhesive capsulitis of right shoulder: Secondary | ICD-10-CM | POA: Diagnosis not present

## 2019-02-02 DIAGNOSIS — M7501 Adhesive capsulitis of right shoulder: Secondary | ICD-10-CM | POA: Diagnosis not present

## 2019-07-10 DIAGNOSIS — Z4889 Encounter for other specified surgical aftercare: Secondary | ICD-10-CM | POA: Diagnosis not present

## 2019-07-10 DIAGNOSIS — M25511 Pain in right shoulder: Secondary | ICD-10-CM | POA: Diagnosis not present

## 2019-07-10 DIAGNOSIS — M19011 Primary osteoarthritis, right shoulder: Secondary | ICD-10-CM | POA: Diagnosis not present

## 2019-07-10 DIAGNOSIS — M7541 Impingement syndrome of right shoulder: Secondary | ICD-10-CM | POA: Diagnosis not present

## 2019-07-10 DIAGNOSIS — G8918 Other acute postprocedural pain: Secondary | ICD-10-CM | POA: Diagnosis not present

## 2019-07-10 DIAGNOSIS — I9789 Other postprocedural complications and disorders of the circulatory system, not elsewhere classified: Secondary | ICD-10-CM | POA: Diagnosis not present

## 2019-07-10 DIAGNOSIS — M7501 Adhesive capsulitis of right shoulder: Secondary | ICD-10-CM | POA: Diagnosis not present

## 2019-07-10 DIAGNOSIS — M659 Synovitis and tenosynovitis, unspecified: Secondary | ICD-10-CM | POA: Diagnosis not present

## 2019-07-10 DIAGNOSIS — M24111 Other articular cartilage disorders, right shoulder: Secondary | ICD-10-CM | POA: Diagnosis not present

## 2019-07-13 DIAGNOSIS — M25611 Stiffness of right shoulder, not elsewhere classified: Secondary | ICD-10-CM | POA: Diagnosis not present

## 2019-07-13 DIAGNOSIS — M25511 Pain in right shoulder: Secondary | ICD-10-CM | POA: Diagnosis not present

## 2019-07-14 DIAGNOSIS — M25611 Stiffness of right shoulder, not elsewhere classified: Secondary | ICD-10-CM | POA: Diagnosis not present

## 2019-07-14 DIAGNOSIS — M25511 Pain in right shoulder: Secondary | ICD-10-CM | POA: Diagnosis not present

## 2019-07-15 DIAGNOSIS — M25511 Pain in right shoulder: Secondary | ICD-10-CM | POA: Diagnosis not present

## 2019-07-15 DIAGNOSIS — M25611 Stiffness of right shoulder, not elsewhere classified: Secondary | ICD-10-CM | POA: Diagnosis not present

## 2019-07-17 DIAGNOSIS — M25511 Pain in right shoulder: Secondary | ICD-10-CM | POA: Diagnosis not present

## 2019-07-17 DIAGNOSIS — M7501 Adhesive capsulitis of right shoulder: Secondary | ICD-10-CM | POA: Diagnosis not present

## 2019-07-17 DIAGNOSIS — M25611 Stiffness of right shoulder, not elsewhere classified: Secondary | ICD-10-CM | POA: Diagnosis not present

## 2019-07-20 DIAGNOSIS — M25611 Stiffness of right shoulder, not elsewhere classified: Secondary | ICD-10-CM | POA: Diagnosis not present

## 2019-07-20 DIAGNOSIS — M25511 Pain in right shoulder: Secondary | ICD-10-CM | POA: Diagnosis not present

## 2019-07-22 DIAGNOSIS — M25511 Pain in right shoulder: Secondary | ICD-10-CM | POA: Diagnosis not present

## 2019-07-22 DIAGNOSIS — M25611 Stiffness of right shoulder, not elsewhere classified: Secondary | ICD-10-CM | POA: Diagnosis not present

## 2019-07-23 DIAGNOSIS — M25511 Pain in right shoulder: Secondary | ICD-10-CM | POA: Diagnosis not present

## 2019-07-23 DIAGNOSIS — Z4889 Encounter for other specified surgical aftercare: Secondary | ICD-10-CM | POA: Diagnosis not present

## 2019-07-23 DIAGNOSIS — M19011 Primary osteoarthritis, right shoulder: Secondary | ICD-10-CM | POA: Diagnosis not present

## 2019-07-23 DIAGNOSIS — I9789 Other postprocedural complications and disorders of the circulatory system, not elsewhere classified: Secondary | ICD-10-CM | POA: Diagnosis not present

## 2019-07-29 DIAGNOSIS — M25511 Pain in right shoulder: Secondary | ICD-10-CM | POA: Diagnosis not present

## 2019-07-29 DIAGNOSIS — M25611 Stiffness of right shoulder, not elsewhere classified: Secondary | ICD-10-CM | POA: Diagnosis not present

## 2019-07-31 DIAGNOSIS — M25511 Pain in right shoulder: Secondary | ICD-10-CM | POA: Diagnosis not present

## 2019-07-31 DIAGNOSIS — M25611 Stiffness of right shoulder, not elsewhere classified: Secondary | ICD-10-CM | POA: Diagnosis not present

## 2019-08-03 DIAGNOSIS — D225 Melanocytic nevi of trunk: Secondary | ICD-10-CM | POA: Diagnosis not present

## 2019-08-03 DIAGNOSIS — L821 Other seborrheic keratosis: Secondary | ICD-10-CM | POA: Diagnosis not present

## 2019-08-03 DIAGNOSIS — M25511 Pain in right shoulder: Secondary | ICD-10-CM | POA: Diagnosis not present

## 2019-08-03 DIAGNOSIS — L814 Other melanin hyperpigmentation: Secondary | ICD-10-CM | POA: Diagnosis not present

## 2019-08-03 DIAGNOSIS — Z808 Family history of malignant neoplasm of other organs or systems: Secondary | ICD-10-CM | POA: Diagnosis not present

## 2019-08-03 DIAGNOSIS — M25611 Stiffness of right shoulder, not elsewhere classified: Secondary | ICD-10-CM | POA: Diagnosis not present

## 2019-08-05 DIAGNOSIS — M25511 Pain in right shoulder: Secondary | ICD-10-CM | POA: Diagnosis not present

## 2019-08-05 DIAGNOSIS — M25611 Stiffness of right shoulder, not elsewhere classified: Secondary | ICD-10-CM | POA: Diagnosis not present

## 2019-08-07 DIAGNOSIS — M25611 Stiffness of right shoulder, not elsewhere classified: Secondary | ICD-10-CM | POA: Diagnosis not present

## 2019-08-07 DIAGNOSIS — M25511 Pain in right shoulder: Secondary | ICD-10-CM | POA: Diagnosis not present

## 2019-08-10 DIAGNOSIS — M25511 Pain in right shoulder: Secondary | ICD-10-CM | POA: Diagnosis not present

## 2019-08-10 DIAGNOSIS — M25611 Stiffness of right shoulder, not elsewhere classified: Secondary | ICD-10-CM | POA: Diagnosis not present

## 2019-08-11 ENCOUNTER — Encounter: Payer: Self-pay | Admitting: Gynecology

## 2019-08-12 DIAGNOSIS — M25611 Stiffness of right shoulder, not elsewhere classified: Secondary | ICD-10-CM | POA: Diagnosis not present

## 2019-08-12 DIAGNOSIS — M25511 Pain in right shoulder: Secondary | ICD-10-CM | POA: Diagnosis not present

## 2019-08-14 DIAGNOSIS — M25511 Pain in right shoulder: Secondary | ICD-10-CM | POA: Diagnosis not present

## 2019-08-14 DIAGNOSIS — M25611 Stiffness of right shoulder, not elsewhere classified: Secondary | ICD-10-CM | POA: Diagnosis not present

## 2019-08-17 ENCOUNTER — Other Ambulatory Visit: Payer: Self-pay | Admitting: Gynecology

## 2019-08-17 DIAGNOSIS — M25611 Stiffness of right shoulder, not elsewhere classified: Secondary | ICD-10-CM | POA: Diagnosis not present

## 2019-08-17 DIAGNOSIS — M25511 Pain in right shoulder: Secondary | ICD-10-CM | POA: Diagnosis not present

## 2019-08-17 DIAGNOSIS — Z1231 Encounter for screening mammogram for malignant neoplasm of breast: Secondary | ICD-10-CM

## 2019-08-19 DIAGNOSIS — M25611 Stiffness of right shoulder, not elsewhere classified: Secondary | ICD-10-CM | POA: Diagnosis not present

## 2019-08-19 DIAGNOSIS — M25511 Pain in right shoulder: Secondary | ICD-10-CM | POA: Diagnosis not present

## 2019-08-21 DIAGNOSIS — M25511 Pain in right shoulder: Secondary | ICD-10-CM | POA: Diagnosis not present

## 2019-08-21 DIAGNOSIS — M25611 Stiffness of right shoulder, not elsewhere classified: Secondary | ICD-10-CM | POA: Diagnosis not present

## 2019-08-24 DIAGNOSIS — M25511 Pain in right shoulder: Secondary | ICD-10-CM | POA: Diagnosis not present

## 2019-08-24 DIAGNOSIS — M25611 Stiffness of right shoulder, not elsewhere classified: Secondary | ICD-10-CM | POA: Diagnosis not present

## 2019-08-26 DIAGNOSIS — M25511 Pain in right shoulder: Secondary | ICD-10-CM | POA: Diagnosis not present

## 2019-08-26 DIAGNOSIS — M25611 Stiffness of right shoulder, not elsewhere classified: Secondary | ICD-10-CM | POA: Diagnosis not present

## 2019-08-31 DIAGNOSIS — M25511 Pain in right shoulder: Secondary | ICD-10-CM | POA: Diagnosis not present

## 2019-08-31 DIAGNOSIS — M25611 Stiffness of right shoulder, not elsewhere classified: Secondary | ICD-10-CM | POA: Diagnosis not present

## 2019-09-04 DIAGNOSIS — M25611 Stiffness of right shoulder, not elsewhere classified: Secondary | ICD-10-CM | POA: Diagnosis not present

## 2019-09-04 DIAGNOSIS — M25511 Pain in right shoulder: Secondary | ICD-10-CM | POA: Diagnosis not present

## 2019-09-07 DIAGNOSIS — M25511 Pain in right shoulder: Secondary | ICD-10-CM | POA: Diagnosis not present

## 2019-09-07 DIAGNOSIS — M25611 Stiffness of right shoulder, not elsewhere classified: Secondary | ICD-10-CM | POA: Diagnosis not present

## 2019-09-09 DIAGNOSIS — M25511 Pain in right shoulder: Secondary | ICD-10-CM | POA: Diagnosis not present

## 2019-09-09 DIAGNOSIS — M25611 Stiffness of right shoulder, not elsewhere classified: Secondary | ICD-10-CM | POA: Diagnosis not present

## 2019-09-11 DIAGNOSIS — M25511 Pain in right shoulder: Secondary | ICD-10-CM | POA: Diagnosis not present

## 2019-09-11 DIAGNOSIS — M25611 Stiffness of right shoulder, not elsewhere classified: Secondary | ICD-10-CM | POA: Diagnosis not present

## 2019-09-14 DIAGNOSIS — M25511 Pain in right shoulder: Secondary | ICD-10-CM | POA: Diagnosis not present

## 2019-09-14 DIAGNOSIS — M25611 Stiffness of right shoulder, not elsewhere classified: Secondary | ICD-10-CM | POA: Diagnosis not present

## 2019-09-16 DIAGNOSIS — M25611 Stiffness of right shoulder, not elsewhere classified: Secondary | ICD-10-CM | POA: Diagnosis not present

## 2019-09-16 DIAGNOSIS — M25511 Pain in right shoulder: Secondary | ICD-10-CM | POA: Diagnosis not present

## 2019-09-23 DIAGNOSIS — M25611 Stiffness of right shoulder, not elsewhere classified: Secondary | ICD-10-CM | POA: Diagnosis not present

## 2019-09-23 DIAGNOSIS — M25511 Pain in right shoulder: Secondary | ICD-10-CM | POA: Diagnosis not present

## 2019-09-25 DIAGNOSIS — M25611 Stiffness of right shoulder, not elsewhere classified: Secondary | ICD-10-CM | POA: Diagnosis not present

## 2019-09-25 DIAGNOSIS — M25511 Pain in right shoulder: Secondary | ICD-10-CM | POA: Diagnosis not present

## 2019-09-30 DIAGNOSIS — M25611 Stiffness of right shoulder, not elsewhere classified: Secondary | ICD-10-CM | POA: Diagnosis not present

## 2019-09-30 DIAGNOSIS — M25511 Pain in right shoulder: Secondary | ICD-10-CM | POA: Diagnosis not present

## 2019-10-02 ENCOUNTER — Ambulatory Visit
Admission: RE | Admit: 2019-10-02 | Discharge: 2019-10-02 | Disposition: A | Discharge status: Home / Self Care | Payer: Managed Care, Other (non HMO) | Source: Ambulatory Visit | Attending: Gynecology | Admitting: Gynecology

## 2019-10-02 ENCOUNTER — Other Ambulatory Visit: Payer: Self-pay

## 2019-10-02 DIAGNOSIS — M25511 Pain in right shoulder: Secondary | ICD-10-CM | POA: Diagnosis not present

## 2019-10-02 DIAGNOSIS — Z1231 Encounter for screening mammogram for malignant neoplasm of breast: Secondary | ICD-10-CM | POA: Diagnosis not present

## 2019-10-02 DIAGNOSIS — M25611 Stiffness of right shoulder, not elsewhere classified: Secondary | ICD-10-CM | POA: Diagnosis not present

## 2019-10-05 DIAGNOSIS — M25511 Pain in right shoulder: Secondary | ICD-10-CM | POA: Diagnosis not present

## 2019-10-05 DIAGNOSIS — M25611 Stiffness of right shoulder, not elsewhere classified: Secondary | ICD-10-CM | POA: Diagnosis not present

## 2019-10-13 DIAGNOSIS — M25511 Pain in right shoulder: Secondary | ICD-10-CM | POA: Diagnosis not present

## 2019-10-13 DIAGNOSIS — M25611 Stiffness of right shoulder, not elsewhere classified: Secondary | ICD-10-CM | POA: Diagnosis not present

## 2020-01-14 DIAGNOSIS — Z1151 Encounter for screening for human papillomavirus (HPV): Secondary | ICD-10-CM | POA: Diagnosis not present

## 2020-01-14 DIAGNOSIS — Z6825 Body mass index (BMI) 25.0-25.9, adult: Secondary | ICD-10-CM | POA: Diagnosis not present

## 2020-01-14 DIAGNOSIS — Z01419 Encounter for gynecological examination (general) (routine) without abnormal findings: Secondary | ICD-10-CM | POA: Diagnosis not present

## 2020-01-21 DIAGNOSIS — L719 Rosacea, unspecified: Secondary | ICD-10-CM | POA: Diagnosis not present

## 2020-04-28 DIAGNOSIS — Z0000 Encounter for general adult medical examination without abnormal findings: Secondary | ICD-10-CM | POA: Diagnosis not present

## 2020-04-28 DIAGNOSIS — Z13220 Encounter for screening for lipoid disorders: Secondary | ICD-10-CM | POA: Diagnosis not present

## 2020-05-31 DIAGNOSIS — Z23 Encounter for immunization: Secondary | ICD-10-CM | POA: Diagnosis not present

## 2020-09-02 ENCOUNTER — Other Ambulatory Visit: Payer: Self-pay | Admitting: Obstetrics and Gynecology

## 2020-09-02 DIAGNOSIS — Z1231 Encounter for screening mammogram for malignant neoplasm of breast: Secondary | ICD-10-CM

## 2020-09-13 DIAGNOSIS — M25571 Pain in right ankle and joints of right foot: Secondary | ICD-10-CM | POA: Diagnosis not present

## 2020-10-04 ENCOUNTER — Ambulatory Visit
Admission: RE | Admit: 2020-10-04 | Discharge: 2020-10-04 | Disposition: A | Discharge status: Home / Self Care | Payer: BC Managed Care – PPO | Source: Ambulatory Visit | Attending: Obstetrics and Gynecology | Admitting: Obstetrics and Gynecology

## 2020-10-04 ENCOUNTER — Other Ambulatory Visit: Payer: Self-pay

## 2020-10-04 DIAGNOSIS — Z1231 Encounter for screening mammogram for malignant neoplasm of breast: Secondary | ICD-10-CM

## 2020-10-10 ENCOUNTER — Other Ambulatory Visit: Payer: Self-pay | Admitting: Obstetrics and Gynecology

## 2020-10-10 DIAGNOSIS — R928 Other abnormal and inconclusive findings on diagnostic imaging of breast: Secondary | ICD-10-CM

## 2020-10-20 ENCOUNTER — Ambulatory Visit
Admission: RE | Admit: 2020-10-20 | Discharge: 2020-10-20 | Disposition: A | Discharge status: Home / Self Care | Payer: BC Managed Care – PPO | Source: Ambulatory Visit | Attending: Obstetrics and Gynecology | Admitting: Obstetrics and Gynecology

## 2020-10-20 ENCOUNTER — Ambulatory Visit: Admission: RE | Admit: 2020-10-20 | Payer: BC Managed Care – PPO | Source: Ambulatory Visit

## 2020-10-20 ENCOUNTER — Other Ambulatory Visit: Payer: Self-pay

## 2020-10-20 DIAGNOSIS — R928 Other abnormal and inconclusive findings on diagnostic imaging of breast: Secondary | ICD-10-CM

## 2021-01-05 DIAGNOSIS — Z808 Family history of malignant neoplasm of other organs or systems: Secondary | ICD-10-CM | POA: Diagnosis not present

## 2021-01-05 DIAGNOSIS — L814 Other melanin hyperpigmentation: Secondary | ICD-10-CM | POA: Diagnosis not present

## 2021-01-05 DIAGNOSIS — D225 Melanocytic nevi of trunk: Secondary | ICD-10-CM | POA: Diagnosis not present

## 2021-01-05 DIAGNOSIS — L821 Other seborrheic keratosis: Secondary | ICD-10-CM | POA: Diagnosis not present

## 2021-02-16 IMAGING — MG MM DIGITAL DIAGNOSTIC UNILAT*R* W/ TOMO W/ CAD
6 series · 6 of 18 positions shown · non-contrast
Comparison: Previous exam(s).

CLINICAL DATA: Patient returns today to evaluate a possible RIGHT
breast asymmetry questioned on recent screening mammogram.

EXAM:
DIGITAL DIAGNOSTIC UNILATERAL RIGHT MAMMOGRAM WITH TOMO AND CAD

[R MLO synth-2D]
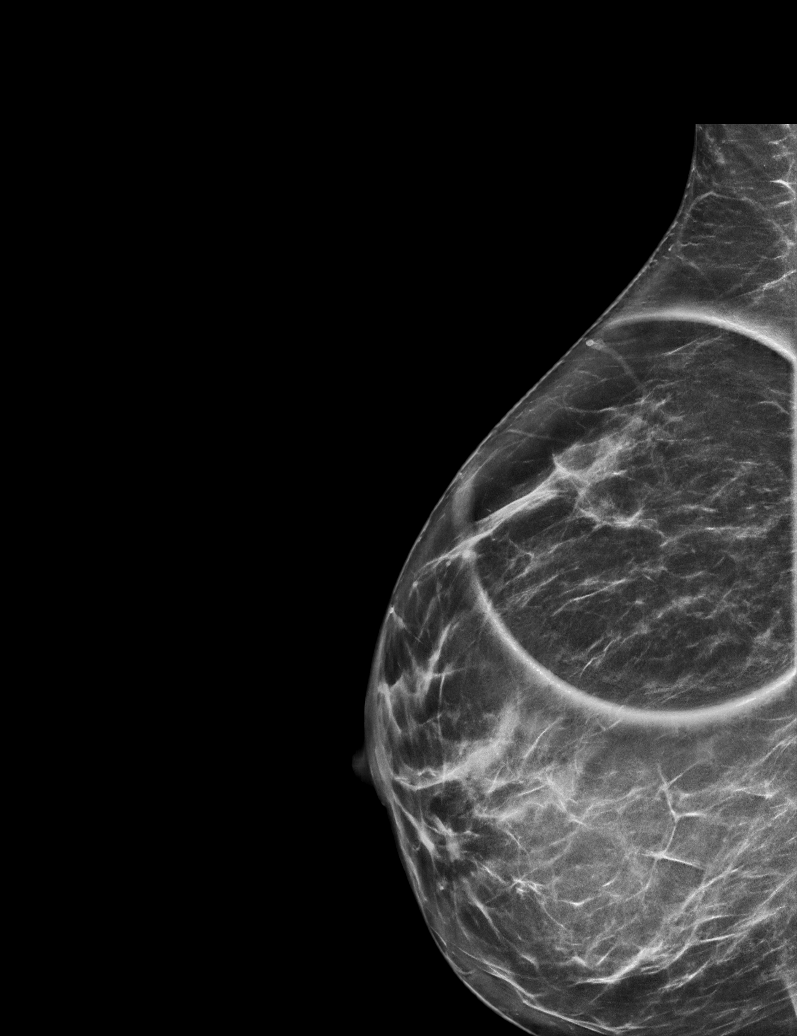

[R ML synth-2D (1 of 2)]
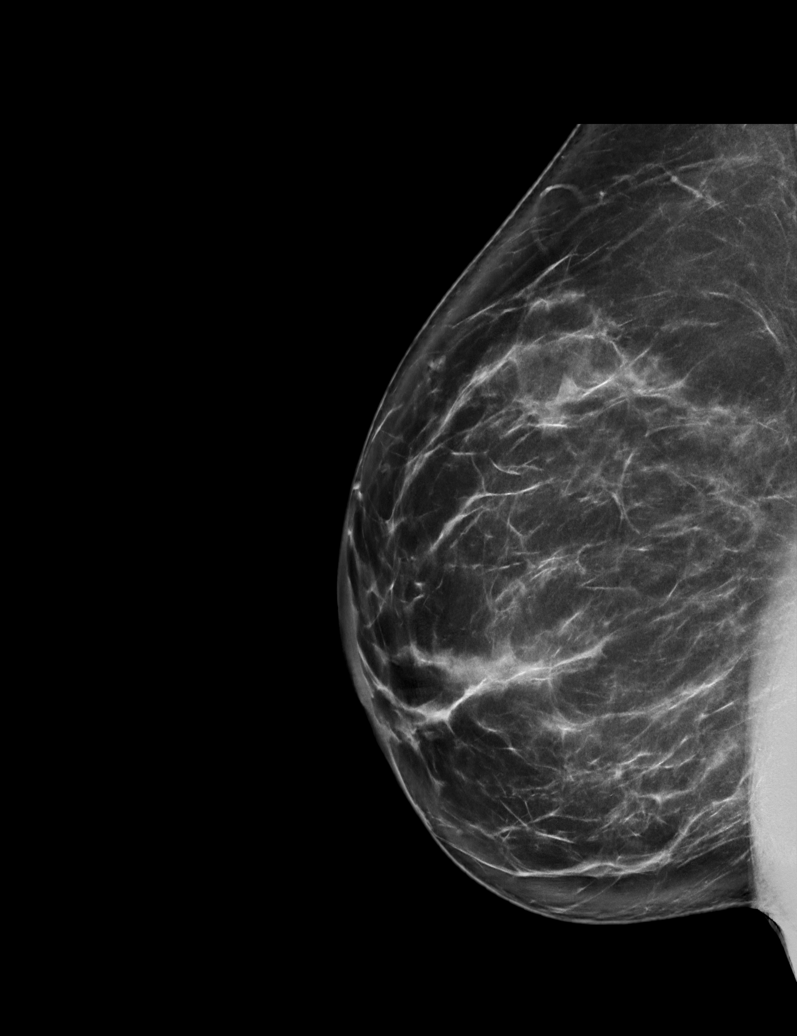

[R ML synth-2D (2 of 2)]
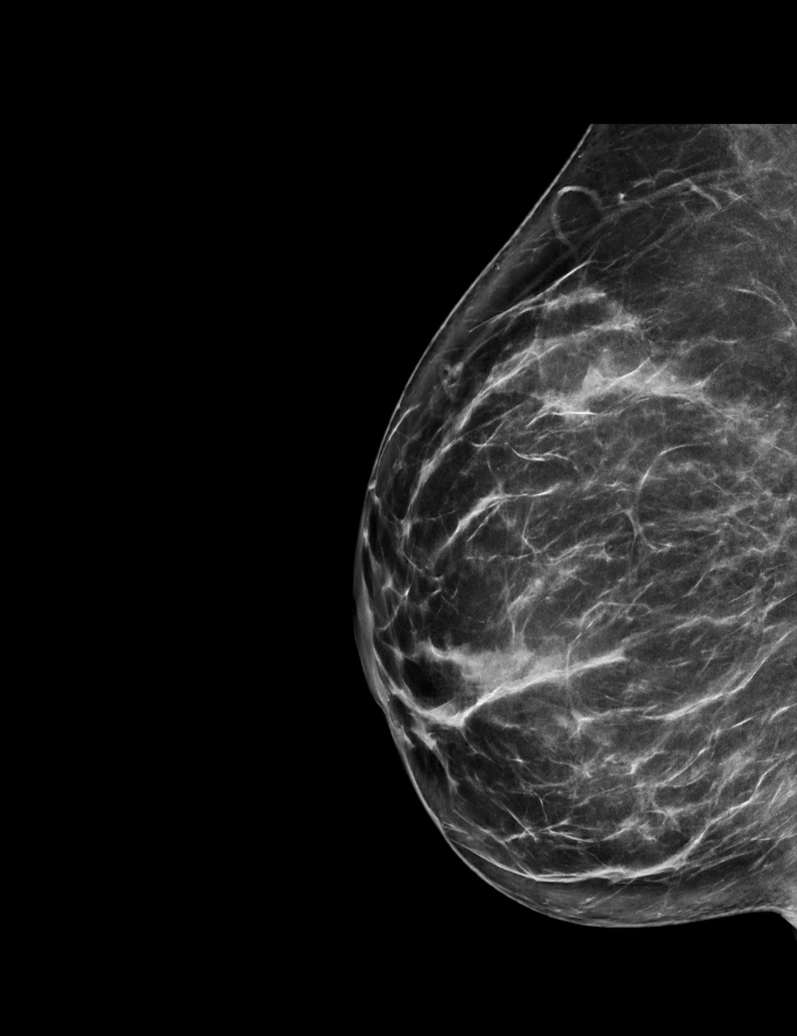

[R ML tomo (1 of 2) · tomo slice 41/80.0]
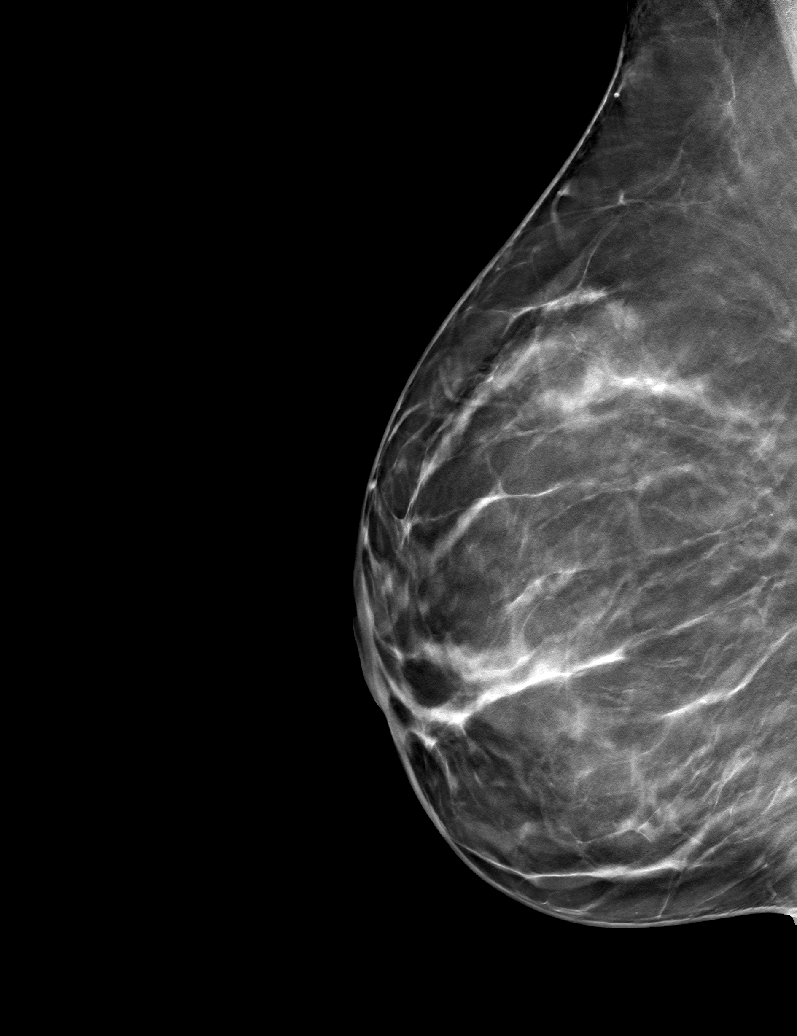

[R MLO tomo · tomo slice 37/74.0]
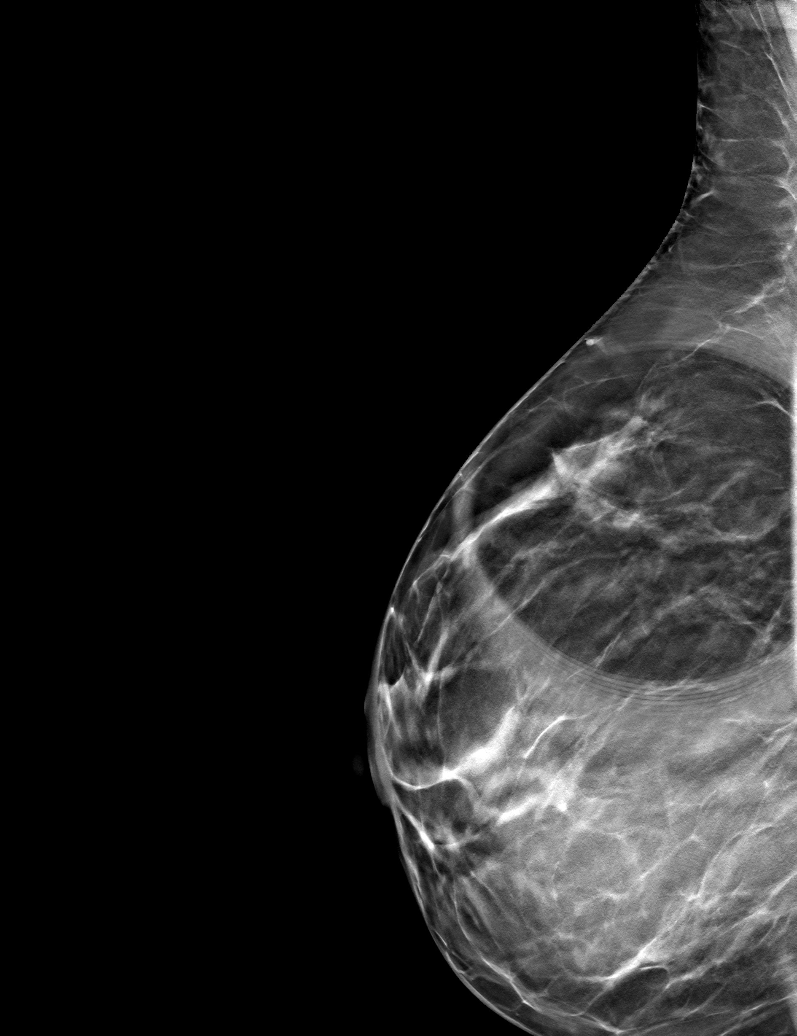

[R ML tomo (2 of 2) · tomo slice 42/83.0]
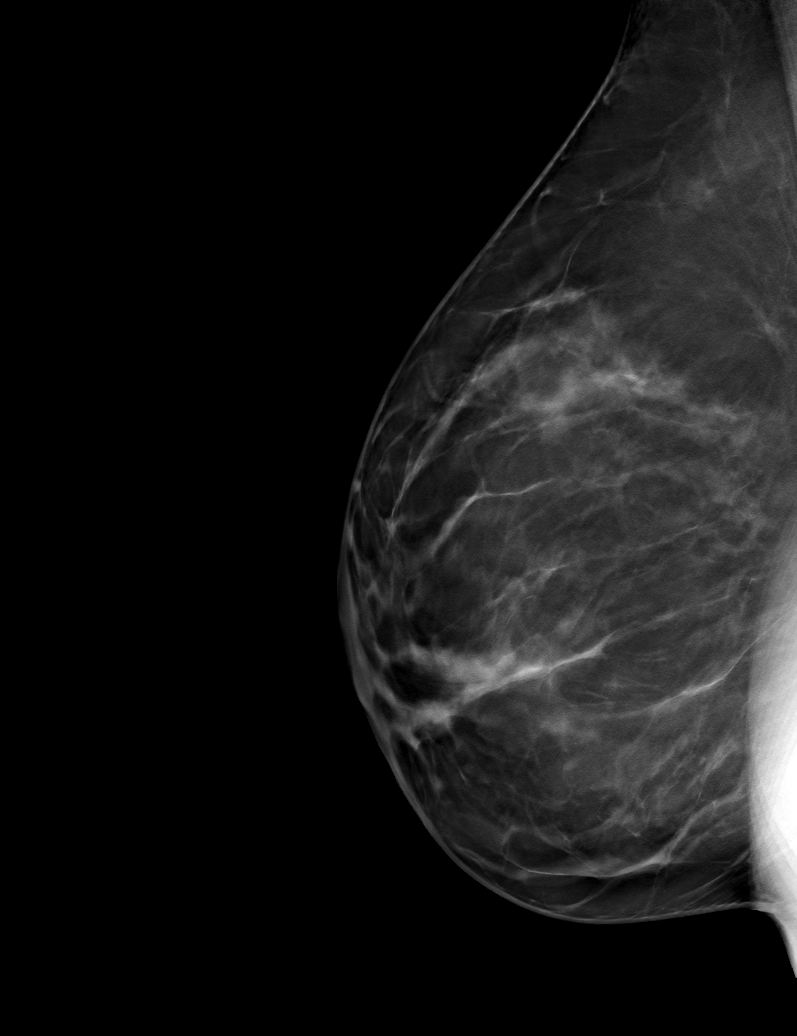

[6 of 18 positions shown; findings below may reference images not displayed]

ACR Breast Density Category b: There are scattered areas of
fibroglandular density.
FINDINGS: On today's additional diagnostic views, including spot compression
with 3D tomosynthesis, there is no persistent asymmetry within the
upper RIGHT breast indicating superimposition of normal dense
fibroglandular tissues.

Mammographic images were processed with CAD.
IMPRESSION: No evidence of malignancy.

Patient may return to routine annual bilateral screening mammogram
schedule.

RECOMMENDATION:
Screening mammogram in one year.(Code:F9-Y-89F)

I have discussed the findings and recommendations with the patient.
If applicable, a reminder letter will be sent to the patient
regarding the next appointment.

BI-RADS CATEGORY  1: Negative.

## 2021-02-27 DIAGNOSIS — Z01419 Encounter for gynecological examination (general) (routine) without abnormal findings: Secondary | ICD-10-CM | POA: Diagnosis not present

## 2021-02-27 DIAGNOSIS — Z9889 Other specified postprocedural states: Secondary | ICD-10-CM | POA: Diagnosis not present

## 2021-08-30 DIAGNOSIS — M79605 Pain in left leg: Secondary | ICD-10-CM | POA: Diagnosis not present

## 2021-08-30 DIAGNOSIS — M25552 Pain in left hip: Secondary | ICD-10-CM | POA: Diagnosis not present

## 2021-09-05 DIAGNOSIS — M25552 Pain in left hip: Secondary | ICD-10-CM | POA: Diagnosis not present

## 2021-09-26 DIAGNOSIS — M25552 Pain in left hip: Secondary | ICD-10-CM | POA: Diagnosis not present

## 2021-10-03 ENCOUNTER — Other Ambulatory Visit: Payer: Self-pay | Admitting: Obstetrics and Gynecology

## 2021-10-03 DIAGNOSIS — M25552 Pain in left hip: Secondary | ICD-10-CM | POA: Diagnosis not present

## 2021-10-03 DIAGNOSIS — Z1231 Encounter for screening mammogram for malignant neoplasm of breast: Secondary | ICD-10-CM

## 2021-10-24 DIAGNOSIS — M79671 Pain in right foot: Secondary | ICD-10-CM | POA: Diagnosis not present

## 2021-11-11 DIAGNOSIS — Z20822 Contact with and (suspected) exposure to covid-19: Secondary | ICD-10-CM | POA: Diagnosis not present

## 2021-11-11 DIAGNOSIS — U071 COVID-19: Secondary | ICD-10-CM | POA: Diagnosis not present

## 2021-11-27 ENCOUNTER — Ambulatory Visit
Admission: RE | Admit: 2021-11-27 | Discharge: 2021-11-27 | Disposition: A | Discharge status: Home / Self Care | Payer: BC Managed Care – PPO | Source: Ambulatory Visit | Attending: Obstetrics and Gynecology | Admitting: Obstetrics and Gynecology

## 2021-11-27 DIAGNOSIS — Z1231 Encounter for screening mammogram for malignant neoplasm of breast: Secondary | ICD-10-CM | POA: Diagnosis not present

## 2022-01-25 DIAGNOSIS — Z13220 Encounter for screening for lipoid disorders: Secondary | ICD-10-CM | POA: Diagnosis not present

## 2022-01-25 DIAGNOSIS — Z0000 Encounter for general adult medical examination without abnormal findings: Secondary | ICD-10-CM | POA: Diagnosis not present

## 2022-01-25 DIAGNOSIS — Z1211 Encounter for screening for malignant neoplasm of colon: Secondary | ICD-10-CM | POA: Diagnosis not present

## 2022-03-07 DIAGNOSIS — Z01419 Encounter for gynecological examination (general) (routine) without abnormal findings: Secondary | ICD-10-CM | POA: Diagnosis not present

## 2022-03-07 DIAGNOSIS — Z9889 Other specified postprocedural states: Secondary | ICD-10-CM | POA: Diagnosis not present

## 2022-06-13 DIAGNOSIS — D123 Benign neoplasm of transverse colon: Secondary | ICD-10-CM | POA: Diagnosis not present

## 2022-06-13 DIAGNOSIS — D125 Benign neoplasm of sigmoid colon: Secondary | ICD-10-CM | POA: Diagnosis not present

## 2022-06-13 DIAGNOSIS — Z1211 Encounter for screening for malignant neoplasm of colon: Secondary | ICD-10-CM | POA: Diagnosis not present

## 2022-09-11 DIAGNOSIS — F4320 Adjustment disorder, unspecified: Secondary | ICD-10-CM | POA: Diagnosis not present

## 2022-09-21 DIAGNOSIS — M5451 Vertebrogenic low back pain: Secondary | ICD-10-CM | POA: Diagnosis not present

## 2022-10-02 DIAGNOSIS — F4320 Adjustment disorder, unspecified: Secondary | ICD-10-CM | POA: Diagnosis not present

## 2022-10-09 DIAGNOSIS — M5416 Radiculopathy, lumbar region: Secondary | ICD-10-CM | POA: Diagnosis not present

## 2022-10-15 DIAGNOSIS — M5416 Radiculopathy, lumbar region: Secondary | ICD-10-CM | POA: Diagnosis not present

## 2022-10-17 DIAGNOSIS — M5416 Radiculopathy, lumbar region: Secondary | ICD-10-CM | POA: Diagnosis not present

## 2022-10-19 DIAGNOSIS — F4320 Adjustment disorder, unspecified: Secondary | ICD-10-CM | POA: Diagnosis not present

## 2022-10-23 DIAGNOSIS — F4320 Adjustment disorder, unspecified: Secondary | ICD-10-CM | POA: Diagnosis not present

## 2022-10-24 DIAGNOSIS — M5416 Radiculopathy, lumbar region: Secondary | ICD-10-CM | POA: Diagnosis not present

## 2022-10-26 DIAGNOSIS — M5416 Radiculopathy, lumbar region: Secondary | ICD-10-CM | POA: Diagnosis not present

## 2022-10-29 ENCOUNTER — Other Ambulatory Visit: Payer: Self-pay | Admitting: Obstetrics and Gynecology

## 2022-10-29 DIAGNOSIS — M5416 Radiculopathy, lumbar region: Secondary | ICD-10-CM | POA: Diagnosis not present

## 2022-10-29 DIAGNOSIS — Z1231 Encounter for screening mammogram for malignant neoplasm of breast: Secondary | ICD-10-CM

## 2022-10-31 DIAGNOSIS — M5416 Radiculopathy, lumbar region: Secondary | ICD-10-CM | POA: Diagnosis not present

## 2022-11-01 DIAGNOSIS — F4320 Adjustment disorder, unspecified: Secondary | ICD-10-CM | POA: Diagnosis not present

## 2022-11-09 DIAGNOSIS — M5416 Radiculopathy, lumbar region: Secondary | ICD-10-CM | POA: Diagnosis not present

## 2022-11-16 DIAGNOSIS — M5416 Radiculopathy, lumbar region: Secondary | ICD-10-CM | POA: Diagnosis not present

## 2022-11-21 DIAGNOSIS — F4320 Adjustment disorder, unspecified: Secondary | ICD-10-CM | POA: Diagnosis not present

## 2022-12-06 DIAGNOSIS — L570 Actinic keratosis: Secondary | ICD-10-CM | POA: Diagnosis not present

## 2022-12-07 DIAGNOSIS — F4320 Adjustment disorder, unspecified: Secondary | ICD-10-CM | POA: Diagnosis not present

## 2022-12-25 DIAGNOSIS — F4320 Adjustment disorder, unspecified: Secondary | ICD-10-CM | POA: Diagnosis not present

## 2022-12-26 ENCOUNTER — Ambulatory Visit: Payer: BC Managed Care – PPO

## 2023-01-02 ENCOUNTER — Ambulatory Visit
Admission: RE | Admit: 2023-01-02 | Discharge: 2023-01-02 | Disposition: A | Discharge status: Home / Self Care | Payer: BC Managed Care – PPO | Source: Ambulatory Visit | Attending: Obstetrics and Gynecology | Admitting: Obstetrics and Gynecology

## 2023-01-02 DIAGNOSIS — Z1231 Encounter for screening mammogram for malignant neoplasm of breast: Secondary | ICD-10-CM | POA: Diagnosis not present

## 2023-01-14 DIAGNOSIS — F4320 Adjustment disorder, unspecified: Secondary | ICD-10-CM | POA: Diagnosis not present

## 2023-01-31 DIAGNOSIS — F4320 Adjustment disorder, unspecified: Secondary | ICD-10-CM | POA: Diagnosis not present

## 2023-02-01 DIAGNOSIS — Z13220 Encounter for screening for lipoid disorders: Secondary | ICD-10-CM | POA: Diagnosis not present

## 2023-02-01 DIAGNOSIS — D649 Anemia, unspecified: Secondary | ICD-10-CM | POA: Diagnosis not present

## 2023-02-01 DIAGNOSIS — Z0000 Encounter for general adult medical examination without abnormal findings: Secondary | ICD-10-CM | POA: Diagnosis not present

## 2023-02-18 DIAGNOSIS — F4320 Adjustment disorder, unspecified: Secondary | ICD-10-CM | POA: Diagnosis not present

## 2023-02-26 DIAGNOSIS — F4320 Adjustment disorder, unspecified: Secondary | ICD-10-CM | POA: Diagnosis not present

## 2023-03-07 ENCOUNTER — Ambulatory Visit (INDEPENDENT_AMBULATORY_CARE_PROVIDER_SITE_OTHER): Payer: BC Managed Care – PPO

## 2023-03-07 ENCOUNTER — Ambulatory Visit (INDEPENDENT_AMBULATORY_CARE_PROVIDER_SITE_OTHER): Payer: BC Managed Care – PPO | Admitting: Podiatry

## 2023-03-07 ENCOUNTER — Encounter: Payer: Self-pay | Admitting: Podiatry

## 2023-03-07 DIAGNOSIS — M7670 Peroneal tendinitis, unspecified leg: Secondary | ICD-10-CM

## 2023-03-07 DIAGNOSIS — M79671 Pain in right foot: Secondary | ICD-10-CM

## 2023-03-07 DIAGNOSIS — M79672 Pain in left foot: Secondary | ICD-10-CM | POA: Diagnosis not present

## 2023-03-07 DIAGNOSIS — D169 Benign neoplasm of bone and articular cartilage, unspecified: Secondary | ICD-10-CM

## 2023-03-08 NOTE — Progress Notes (Signed)
Subjective:   Patient ID: [name], female   DOB: 52 y.o.   MRN: [id number]   HPI Patient presents with painful bone structure on the top of both feet at times if she wears the wrong shoes and history of pain in the outside of the right foot that she was concerned about.  Not having pain in that area currently but this has been going on for a while and patient does not smoke does like to be active and wears over-the-counter insoles   Review of Systems  All other systems reviewed and are negative.       Objective:  Physical Exam Vitals and nursing note reviewed.  Constitutional:      Appearance: She is well-developed.  Pulmonary:     Effort: Pulmonary effort is normal.  Musculoskeletal:        General: Normal range of motion.  Skin:    General: Skin is warm.  Neurological:     Mental Status: She is alert.     Neurovascular status intact muscle strength was found to be adequate range of motion adequate with inflammation of the lateral side of the right foot around the peroneal insertion that is painful when pressed and also is noted to have some prominence at the first metatarsal cuneiform joint bilateral with a cavus foot structure.  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

## 2023-03-19 ENCOUNTER — Other Ambulatory Visit: Payer: Self-pay | Admitting: Podiatry

## 2023-03-19 DIAGNOSIS — D169 Benign neoplasm of bone and articular cartilage, unspecified: Secondary | ICD-10-CM

## 2023-03-19 DIAGNOSIS — M7670 Peroneal tendinitis, unspecified leg: Secondary | ICD-10-CM

## 2023-03-19 DIAGNOSIS — M79671 Pain in right foot: Secondary | ICD-10-CM

## 2023-03-25 DIAGNOSIS — F4320 Adjustment disorder, unspecified: Secondary | ICD-10-CM | POA: Diagnosis not present

## 2023-04-03 DIAGNOSIS — L814 Other melanin hyperpigmentation: Secondary | ICD-10-CM | POA: Diagnosis not present

## 2023-04-03 DIAGNOSIS — D225 Melanocytic nevi of trunk: Secondary | ICD-10-CM | POA: Diagnosis not present

## 2023-04-03 DIAGNOSIS — L821 Other seborrheic keratosis: Secondary | ICD-10-CM | POA: Diagnosis not present

## 2023-04-03 DIAGNOSIS — D485 Neoplasm of uncertain behavior of skin: Secondary | ICD-10-CM | POA: Diagnosis not present

## 2023-04-03 DIAGNOSIS — Z808 Family history of malignant neoplasm of other organs or systems: Secondary | ICD-10-CM | POA: Diagnosis not present

## 2023-04-11 DIAGNOSIS — Z01419 Encounter for gynecological examination (general) (routine) without abnormal findings: Secondary | ICD-10-CM | POA: Diagnosis not present

## 2023-05-28 DIAGNOSIS — F4323 Adjustment disorder with mixed anxiety and depressed mood: Secondary | ICD-10-CM | POA: Diagnosis not present

## 2023-06-17 DIAGNOSIS — F4323 Adjustment disorder with mixed anxiety and depressed mood: Secondary | ICD-10-CM | POA: Diagnosis not present

## 2023-07-11 DIAGNOSIS — F4323 Adjustment disorder with mixed anxiety and depressed mood: Secondary | ICD-10-CM | POA: Diagnosis not present
